# Patient Record
Sex: Male | Born: 1990 | Race: Black or African American | Hispanic: No | Marital: Single | Smoking: Former smoker
Health system: Southern US, Community
[De-identification: ages and names within clinical notes are randomized; demographics above are authoritative.]

## PROBLEM LIST (undated history)

## (undated) DIAGNOSIS — A549 Gonococcal infection, unspecified: Secondary | ICD-10-CM

## (undated) DIAGNOSIS — W3400XA Accidental discharge from unspecified firearms or gun, initial encounter: Secondary | ICD-10-CM

## (undated) HISTORY — PX: COLOSTOMY: SHX63

## (undated) HISTORY — PX: COLON SURGERY: SHX602

---
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
## 2009-12-06 ENCOUNTER — Emergency Department (HOSPITAL_COMMUNITY): Admission: EM | Admit: 2009-12-06 | Discharge: 2009-12-06 | Payer: Self-pay | Admitting: Family Medicine

## 2009-12-24 ENCOUNTER — Ambulatory Visit (HOSPITAL_COMMUNITY): Admission: RE | Admit: 2009-12-24 | Discharge: 2009-12-24 | Payer: Self-pay | Admitting: Orthopedic Surgery

## 2011-06-08 LAB — COMPREHENSIVE METABOLIC PANEL
CO2: 28
Calcium: 9.2
Chloride: 107
Creatinine, Ser: 1.05
Glucose, Bld: 106 — ABNORMAL HIGH
Total Bilirubin: 0.7

## 2011-06-08 LAB — URINALYSIS, ROUTINE W REFLEX MICROSCOPIC
Glucose, UA: NEGATIVE
Ketones, ur: 15 — AB
Protein, ur: NEGATIVE
Specific Gravity, Urine: 1.027
Urobilinogen, UA: 1

## 2011-06-08 LAB — DIFFERENTIAL
Basophils Absolute: 0.1
Basophils Relative: 1
Lymphocytes Relative: 18 — ABNORMAL LOW
Neutro Abs: 6
Neutrophils Relative %: 70

## 2011-06-08 LAB — CBC: RBC: 4.41

## 2011-06-08 LAB — ETHANOL: Alcohol, Ethyl (B): <5

## 2011-06-08 LAB — RAPID URINE DRUG SCREEN, HOSP PERFORMED
Barbiturates: NOT DETECTED
Benzodiazepines: NOT DETECTED
Opiates: NOT DETECTED

## 2012-10-24 ENCOUNTER — Emergency Department (HOSPITAL_COMMUNITY)
Admission: EM | Admit: 2012-10-24 | Discharge: 2012-10-24 | Disposition: A | Discharge status: Home / Self Care | Payer: Self-pay | Source: Home / Self Care

## 2012-12-23 ENCOUNTER — Emergency Department (HOSPITAL_COMMUNITY)
Admission: EM | Admit: 2012-12-23 | Discharge: 2012-12-23 | Disposition: A | Discharge status: Home / Self Care | Payer: Self-pay | Attending: Emergency Medicine | Admitting: Emergency Medicine

## 2012-12-23 ENCOUNTER — Encounter (HOSPITAL_COMMUNITY): Payer: Self-pay | Admitting: Emergency Medicine

## 2012-12-23 DIAGNOSIS — Z202 Contact with and (suspected) exposure to infections with a predominantly sexual mode of transmission: Secondary | ICD-10-CM | POA: Insufficient documentation

## 2012-12-23 MED ORDER — AZITHROMYCIN 250 MG PO TABS
1000.0000 mg | ORAL_TABLET | Freq: Once | ORAL | Status: AC
  Administered 2012-12-23: 1000 mg via ORAL
  Filled 2012-12-23: qty 4

## 2012-12-23 MED ORDER — CEFTRIAXONE SODIUM 250 MG IJ SOLR
250.0000 mg | Freq: Once | INTRAMUSCULAR | Status: AC
  Administered 2012-12-23: 250 mg via INTRAMUSCULAR
  Filled 2012-12-23: qty 250

## 2012-12-23 NOTE — ED Provider Notes (Signed)
History     CSN: [id number]  Arrival date & time 12/23/12  1152   First MD Initiated Contact with Patient 12/23/12 1218      Chief Complaint  Patient presents with  . Penile Discharge    (Consider location/radiation/quality/duration/timing/severity/associated sxs/prior treatment) HPI Comments: Patient is a 22 year old male who presented after an STD exposure. He states his partner told him today that he was exposed to gonorrhea. He states that he was experiencing penile discharge 2 weeks ago which has resolved. No fever, chills, dysuria, dyspareunia, nausea, vomiting, abdominal pain.  Patient is a 22 y.o. male presenting with penile discharge.  Penile Discharge Pertinent negatives include no chills or fever.    History reviewed. No pertinent past medical history.  History reviewed. No pertinent past surgical history.  No family history on file.  History  Substance Use Topics  . Smoking status: Not on file  . Smokeless tobacco: Not on file  . Alcohol Use: Not on file      Review of Systems  Constitutional: Negative for fever and chills.  Genitourinary: Positive for discharge. Negative for dysuria and genital sores.  All other systems reviewed and are negative.    Allergies  Review of patient's allergies indicates not on file.  Home Medications   Current Outpatient Rx  Name  Route  Sig  Dispense  Refill  . acetaminophen (TYLENOL) 500 MG tablet   Oral   Take 500 mg by mouth every 6 (six) hours as needed for pain (pain).           BP 114/68  Pulse 66  Temp(Src) 97.5 F (36.4 C) (Oral)  Resp 16  SpO2 99%  Physical Exam  Nursing note and vitals reviewed. Constitutional: He is oriented to person, place, and time. He appears well-developed and well-nourished. No distress.  HENT:  Head: Normocephalic and atraumatic.  Right Ear: External ear normal.  Left Ear: External ear normal.  Nose: Nose normal.  Eyes: Conjunctivae are normal.  Neck: Normal range  of motion. No tracheal deviation present.  Cardiovascular: Normal rate, regular rhythm and normal heart sounds.   Pulmonary/Chest: Effort normal and breath sounds normal. No stridor.  Abdominal: Soft. He exhibits no distension. There is no tenderness.  Genitourinary: Testes normal and penis normal. Right testis shows no mass, no swelling and no tenderness. Left testis shows no mass, no swelling and no tenderness. Circumcised.  Musculoskeletal: Normal range of motion.  Neurological: He is alert and oriented to person, place, and time.  Skin: Skin is warm and dry. He is not diaphoretic.  Psychiatric: He has a normal mood and affect. His behavior is normal.    ED Course  Procedures (including critical care time)  Labs Reviewed  GC/CHLAMYDIA PROBE AMP   No results found.   1. Exposure to STD       MDM  Patient presents after an exposure to gonorrhea. He noticed some discharge, but does not have any today. Afebrile. Given 250 mg of Rocephin IM, 1 g of azithromycin by mouth. Discussed that he will get a phone call at the result is positive. Return instructions given. And stable for discharge. Patient / Family / Caregiver informed of clinical course, understand medical decision-making process, and agree with plan.         Mora Bellman, PA-C 12/23/12 1236

## 2012-12-23 NOTE — ED Notes (Signed)
Pt was told this am that the person he was sleeping with had an std and that 2 weeks ago he did notice a penis discharge but none today.

## 2012-12-24 LAB — GC/CHLAMYDIA PROBE AMP: GC Probe RNA: POSITIVE — AB

## 2012-12-24 NOTE — ED Provider Notes (Signed)
Medical screening examination/treatment/procedure(s) were performed by non-physician practitioner and as supervising physician I was immediately available for consultation/collaboration.   Suzi Roots, MD 12/24/12 [phone]

## 2012-12-25 NOTE — ED Notes (Signed)
+  gonorrhea Patient treated with Rocephin And Zithromax-[facility] faxed 

## 2012-12-26 NOTE — ED Notes (Signed)
Patient informed of positive results after id'd x 2 and informed of need to notify partner to be treated. 

## 2015-12-28 ENCOUNTER — Emergency Department (HOSPITAL_COMMUNITY)
Admission: EM | Admit: 2015-12-28 | Discharge: 2015-12-28 | Disposition: A | Discharge status: Home / Self Care | Payer: Self-pay | Attending: Emergency Medicine | Admitting: Emergency Medicine

## 2015-12-28 ENCOUNTER — Encounter (HOSPITAL_COMMUNITY): Payer: Self-pay

## 2015-12-28 DIAGNOSIS — R1011 Right upper quadrant pain: Secondary | ICD-10-CM | POA: Insufficient documentation

## 2015-12-28 DIAGNOSIS — R7989 Other specified abnormal findings of blood chemistry: Secondary | ICD-10-CM | POA: Insufficient documentation

## 2015-12-28 DIAGNOSIS — R110 Nausea: Secondary | ICD-10-CM | POA: Insufficient documentation

## 2015-12-28 DIAGNOSIS — R1013 Epigastric pain: Secondary | ICD-10-CM | POA: Insufficient documentation

## 2015-12-28 DIAGNOSIS — F17200 Nicotine dependence, unspecified, uncomplicated: Secondary | ICD-10-CM | POA: Insufficient documentation

## 2015-12-28 LAB — CBC
HEMATOCRIT: 47.4 % (ref 39.0–52.0)
Hemoglobin: 16 g/dL (ref 13.0–17.0)
MCH: 31.1 pg (ref 26.0–34.0)
MCHC: 33.8 g/dL (ref 30.0–36.0)
MCV: 92 fL (ref 78.0–100.0)
PLATELETS: 238 10*3/uL (ref 150–400)
RBC: 5.15 MIL/uL (ref 4.22–5.81)
RDW: 12.7 % (ref 11.5–15.5)
WBC: 5.5 10*3/uL (ref 4.0–10.5)

## 2015-12-28 LAB — COMPREHENSIVE METABOLIC PANEL
ALT: 17 U/L (ref 17–63)
AST: 25 U/L (ref 15–41)
Albumin: 3.9 g/dL (ref 3.5–5.0)
Alkaline Phosphatase: 55 U/L (ref 38–126)
Anion gap: 9 (ref 5–15)
BILIRUBIN TOTAL: 1 mg/dL (ref 0.3–1.2)
BUN: 13 mg/dL (ref 6–20)
CO2: 27 mmol/L (ref 22–32)
Calcium: 9.1 mg/dL (ref 8.9–10.3)
Chloride: 104 mmol/L (ref 101–111)
Creatinine, Ser: 1.25 mg/dL — ABNORMAL HIGH (ref 0.61–1.24)
GFR calc Af Amer: >60 mL/min (ref >60)
Glucose, Bld: 120 mg/dL — ABNORMAL HIGH (ref 65–99)
POTASSIUM: 4 mmol/L (ref 3.5–5.1)
Sodium: 140 mmol/L (ref 135–145)
TOTAL PROTEIN: 7.4 g/dL (ref 6.5–8.1)

## 2015-12-28 LAB — LIPASE, BLOOD: LIPASE: 25 U/L (ref 11–51)

## 2015-12-28 MED ORDER — ONDANSETRON 4 MG PO TBDP
8.0000 mg | ORAL_TABLET | Freq: Once | ORAL | Status: AC
Start: 2015-12-28 — End: 2015-12-28
  Administered 2015-12-28: 8 mg via ORAL
  Filled 2015-12-28: qty 2

## 2015-12-28 MED ORDER — GI COCKTAIL ~~LOC~~
30.0000 mL | Freq: Once | ORAL | Status: AC
  Administered 2015-12-28: 30 mL via ORAL
  Filled 2015-12-28: qty 30

## 2015-12-28 NOTE — Discharge Instructions (Signed)
If you were given medicines take as directed.  If you are on coumadin or contraceptives realize their levels and effectiveness is altered by many different medicines.  If you have any reaction (rash, tongues swelling, other) to the medicines stop taking and see a physician.   Take pepcid from the pharmacy. If your blood pressure was elevated in the ER make sure you follow up for management with a primary doctor or return for chest pain, shortness of breath or stroke symptoms.  Please follow up as directed and return to the ER or see a physician for new or worsening symptoms.  Thank you. Filed Vitals:   12/28/15 0940 12/28/15 1000 12/28/15 1015  BP: 114/84 123/81 115/75  Pulse: 72 66 70  Temp: 98.2 F (36.8 C)    TempSrc: Oral    Resp: 18    Height: 5\' 9"  (1.753 m)    Weight: 170 lb (77.111 kg)    SpO2: 99% 100% 99%

## 2015-12-28 NOTE — ED Notes (Signed)
Pt presents with onset of umbilical pain that woke him from sleep this morning.  +nausea; denies that pain radiates;  Reports last bowel movement was yesterday.

## 2015-12-28 NOTE — ED Provider Notes (Signed)
CSN: [id number]     Arrival date & time 12/28/15  [id number] History   First MD Initiated Contact with Patient 12/28/15 [phone]     Chief Complaint  Patient presents with  . Abdominal Pain     (Consider location/radiation/quality/duration/timing/severity/associated sxs/prior Treatment) HPI Comments: Wayne Pierce is a 25 y.o. male presents with epigastric pain and nausea. Symptoms started this morning. Pain is 8/10, sharp, and intermittent. No radiation. No fever, chills, night sweats. No emesis or hematemesis. No diarrhea, constipation, melena, hematochezia. No abdominal surgeries. No sick contacts, travel to endemic areas, or recent changes in food intake. Endorses weekend of heavy drinking. No NSAID use. No history of acid reflux. Current smoker.     Patient is a 25 y.o. male presenting with abdominal pain. The history is provided by the patient.  Abdominal Pain Pain location:  Epigastric Pain quality: sharp   Pain radiates to:  Does not radiate Associated symptoms: nausea     History reviewed. No pertinent past medical history. History reviewed. No pertinent past surgical history. History reviewed. No pertinent family history. Social History  Substance Use Topics  . Smoking status: Current Every Day Smoker -- 0.50 packs/day  . Smokeless tobacco: None  . Alcohol Use: None    Review of Systems  Gastrointestinal: Positive for nausea and abdominal pain.  All other systems reviewed and are negative.     Allergies  Shellfish allergy  Home Medications   Prior to Admission medications   Medication Sig Start Date End Date Taking? Authorizing Provider  acetaminophen (TYLENOL) 500 MG tablet Take 500 mg by mouth every 6 (six) hours as needed for pain (pain).    Historical Provider, MD   BP 118/80 mmHg  Pulse 72  Temp(Src) 98.2 F (36.8 C) (Oral)  Resp 18  Ht 5\' 9"  (1.753 m)  Wt 77.111 kg  BMI 25.09 kg/m2  SpO2 100% Physical Exam  Constitutional: He appears well-developed and  well-nourished. No distress.  HENT:  Head: Normocephalic and atraumatic.  Mouth/Throat: Oropharynx is clear and moist. No oropharyngeal exudate.  Eyes: Conjunctivae and EOM are normal. Pupils are equal, round, and reactive to light. Right eye exhibits no discharge. Left eye exhibits no discharge. No scleral icterus.  Neck: Normal range of motion. Neck supple.  Cardiovascular: Normal rate, regular rhythm, normal heart sounds and intact distal pulses.   No murmur heard. Pulmonary/Chest: Effort normal and breath sounds normal. No respiratory distress.  Abdominal: Soft. Bowel sounds are normal. There is tenderness ( Epigastric and RUQ). There is no rebound and no guarding (mild in epigastric).  Musculoskeletal: Normal range of motion.  Lymphadenopathy:    He has no cervical adenopathy.  Neurological: He is alert. Coordination normal.  Skin: Skin is warm and dry. He is not diaphoretic. Pallor: mild in epigastric and RUQ.  Psychiatric: He has a normal mood and affect. His behavior is normal.    ED Course  Procedures (including critical care time) Labs Review Labs Reviewed  COMPREHENSIVE METABOLIC PANEL - Abnormal; Notable for the following:    Glucose, Bld 120 (*)    Creatinine, Ser 1.25 (*)    All other components within normal limits  LIPASE, BLOOD  CBC    Imaging Review No results found. I have personally reviewed and evaluated these images and lab results as part of my medical decision-making.   EKG Interpretation None      MDM   Final diagnoses:  Epigastric pain    Wayne Pierce is a 25 y.o. male presents  to ED with complaint of epigastric pain and nausea x 1 day. Patient is afebrile, non-toxic, and VSS. Positive bowel sounds. Abdomen is soft. TTP of epigastric and RUQ. Mild elevation of serum creatinine at 1.25 from previous lab - may be transient; no urinary complaints, no flank pain. Doubt pancreatitis - lipase normal. CBC unremarkable. [place] of gallbladder performed by Dr.  Jodi Mourning shows normal appearing gallbladder and common bile duct; doubt acute cholecystitis or cholangitis. Will give Zofran and GI cocktail. Patient endorses weekend of heavy drinking. Suspect GI in nature ?gastritis, ?GERD, ?PUD. Denies hematemesis, hematechezia, or melena. Recommended pepcid. Follow up with primary care provider in next three days - contact information provided. Return precautions provided.     Lona Kettle, PA-C 12/28/15 1810  Blane Ohara, MD 12/29/15 [phone]

## 2016-01-14 ENCOUNTER — Encounter (HOSPITAL_COMMUNITY): Payer: Self-pay

## 2016-01-14 ENCOUNTER — Emergency Department (HOSPITAL_COMMUNITY)
Admission: EM | Admit: 2016-01-14 | Discharge: 2016-01-14 | Disposition: A | Discharge status: Home / Self Care | Payer: Self-pay | Attending: Emergency Medicine | Admitting: Emergency Medicine

## 2016-01-14 DIAGNOSIS — N342 Other urethritis: Secondary | ICD-10-CM | POA: Insufficient documentation

## 2016-01-14 DIAGNOSIS — F17200 Nicotine dependence, unspecified, uncomplicated: Secondary | ICD-10-CM | POA: Insufficient documentation

## 2016-01-14 HISTORY — DX: Gonococcal infection, unspecified: A54.9

## 2016-01-14 MED ORDER — AZITHROMYCIN 250 MG PO TABS
1000.0000 mg | ORAL_TABLET | Freq: Once | ORAL | Status: AC
  Administered 2016-01-14: 1000 mg via ORAL
  Filled 2016-01-14: qty 4

## 2016-01-14 MED ORDER — CEFTRIAXONE SODIUM 250 MG IJ SOLR
250.0000 mg | Freq: Once | INTRAMUSCULAR | Status: AC
  Administered 2016-01-14: 250 mg via INTRAMUSCULAR
  Filled 2016-01-14: qty 250

## 2016-01-14 NOTE — ED Provider Notes (Signed)
CSN: [id number]     Arrival date & time 01/14/16  0813 History   First MD Initiated Contact with Patient 01/14/16 [phone]     No chief complaint on file.    (Consider location/radiation/quality/duration/timing/severity/associated sxs/prior Treatment) HPI Comments: Patient presents with complaint of penile discharge like previous gonococcal infection. No fever, testicular pain or scrotal swelling. No abdominal pain, nausea or vomiting. He has no difficulty urinating. No rash or ulcerations.   The history is provided by the patient. No language interpreter was used.    Past Medical History  Diagnosis Date  . Gonorrhea in male    History reviewed. No pertinent past surgical history. History reviewed. No pertinent family history. Social History  Substance Use Topics  . Smoking status: Current Every Day Smoker -- 0.50 packs/day  . Smokeless tobacco: None  . Alcohol Use: None    Review of Systems  Constitutional: Negative for fever.  Gastrointestinal: Negative for abdominal pain.  Genitourinary: Positive for discharge. Negative for difficulty urinating and testicular pain.  Skin: Negative for rash and wound.      Allergies  Shellfish allergy  Home Medications   Prior to Admission medications   Medication Sig Start Date End Date Taking? Authorizing Provider  acetaminophen (TYLENOL) 500 MG tablet Take 500 mg by mouth every 6 (six) hours as needed for pain (pain).    Historical Provider, MD   BP 135/74 mmHg  Pulse 98  Temp(Src) 98.7 F (37.1 C) (Oral)  Resp 18  Ht 5\' 9"  (1.753 m)  Wt 79.379 kg  BMI 25.83 kg/m2  SpO2 100% Physical Exam  Constitutional: He is oriented to person, place, and time. He appears well-developed and well-nourished.  Neck: Normal range of motion.  Pulmonary/Chest: Effort normal.  Abdominal: Soft. There is no tenderness.  Genitourinary:  Green penile discharge. No penile swelling, redness or lesion. No testicular pain or scrotal swelling.    Musculoskeletal: Normal range of motion.  Lymphadenopathy:       Right: Inguinal adenopathy present.       Left: Inguinal adenopathy present.  Neurological: He is alert and oriented to person, place, and time.  Skin: Skin is warm and dry.  Psychiatric: He has a normal mood and affect.    ED Course  Procedures (including critical care time) Labs Review Labs Reviewed  GC/CHLAMYDIA PROBE AMP () NOT AT [facility]    Imaging Review No results found. I have personally reviewed and evaluated these images and lab results as part of my medical decision-making.   EKG Interpretation None      MDM   Final diagnoses:  None    1. 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 Discussed condom use. Discussed further STD testing, specifically RPR and HIV. Patient declined and stated he has been tested for these infections.     Elpidio AnisShari Evadean Sproule, PA-C 01/14/16 0840  Benjiman CoreNathan Pickering, MD 01/14/16 [phone]

## 2016-01-14 NOTE — Discharge Instructions (Signed)
Urethritis, Adult °Urethritis is an inflammation of the tube through which urine exits your bladder (urethra).  °CAUSES °Urethritis is often caused by an infection in your urethra. The infection can be viral, like herpes. The infection can also be bacterial, like gonorrhea. °RISK FACTORS °Risk factors of urethritis include: °· Having sex without using a condom. °· Having multiple sexual partners. °· Having poor hygiene. °SIGNS AND SYMPTOMS °Symptoms of urethritis are less noticeable in women than in men. These symptoms include: °· Burning feeling when you urinate (dysuria). °· Discharge from your urethra. °· Blood in your urine (hematuria). °· Urinating more than usual. °DIAGNOSIS  °To confirm a diagnosis of urethritis, your health care provider will do the following: °· Ask about your sexual history. °· Perform a physical exam. °· Have you provide a sample of your urine for lab testing. °· Use a cotton swab to gently collect a sample from your urethra for lab testing. °TREATMENT  °It is important to treat urethritis. Depending on the cause, untreated urethritis may lead to serious genital infections and possibly infertility. Urethritis caused by a bacterial infection is treated with antibiotic medicine. All sexual partners must be treated.  °HOME CARE INSTRUCTIONS °· Do not have sex until the test results are known and treatment is completed, even if your symptoms go away before you finish treatment. °· If you were prescribed an antibiotic, finish it all even if you start to feel better. °SEEK MEDICAL CARE IF:  °· Your symptoms are not improved in 3 days. °· Your symptoms are getting worse. °· You develop abdominal pain or pelvic pain (in women). °· You develop joint pain. °· You have a fever. °SEEK IMMEDIATE MEDICAL CARE IF:  °· You have severe pain in the belly, back, or side. °· You have repeated vomiting. °MAKE SURE YOU: °· Understand these instructions. °· Will watch your condition. °· Will get help right away  if you are not doing well or get worse. °  °This information is not intended to replace advice given to you by your health care provider. Make sure you discuss any questions you have with your health care provider. °  °Document Released: 01/31/2001 Document Revised: 12/22/2014 Document Reviewed: 04/07/2013 °Elsevier Interactive Patient Education ©2016 [employer] ° °

## 2016-01-14 NOTE — ED Notes (Signed)
Pt presents with penile drainage that began this morning, describes as green; reports suprapubic pain that began today as well.

## 2016-01-18 LAB — GC/CHLAMYDIA PROBE AMP (~~LOC~~) NOT AT ARMC
CHLAMYDIA, DNA PROBE: NEGATIVE
NEISSERIA GONORRHEA: POSITIVE — AB

## 2016-01-19 ENCOUNTER — Telehealth: Payer: Self-pay | Admitting: *Deleted

## 2016-01-19 NOTE — ED Notes (Signed)
(+)   GC, unable to contact by phone, letter sent. 

## 2016-01-19 NOTE — ED Notes (Signed)
Spoke with patient, verified ID, informed of labs, treated per protocol, [employer] form faxed, patient informed to abstain for sexual activity x 10 days and notify sexual partners for evaluation and treatment 

## 2018-06-21 ENCOUNTER — Other Ambulatory Visit: Payer: Self-pay

## 2018-06-21 ENCOUNTER — Emergency Department (HOSPITAL_COMMUNITY): Payer: Self-pay

## 2018-06-21 ENCOUNTER — Emergency Department (HOSPITAL_COMMUNITY)
Admission: EM | Admit: 2018-06-21 | Discharge: 2018-06-21 | Discharge status: Against Medical Advice | Payer: Self-pay | Attending: Emergency Medicine | Admitting: Emergency Medicine

## 2018-06-21 ENCOUNTER — Encounter (HOSPITAL_COMMUNITY): Payer: Self-pay

## 2018-06-21 DIAGNOSIS — F17210 Nicotine dependence, cigarettes, uncomplicated: Secondary | ICD-10-CM | POA: Insufficient documentation

## 2018-06-21 DIAGNOSIS — Z5320 Procedure and treatment not carried out because of patient's decision for unspecified reasons: Secondary | ICD-10-CM | POA: Insufficient documentation

## 2018-06-21 DIAGNOSIS — M549 Dorsalgia, unspecified: Secondary | ICD-10-CM

## 2018-06-21 DIAGNOSIS — M545 Low back pain: Secondary | ICD-10-CM | POA: Insufficient documentation

## 2018-06-21 DIAGNOSIS — M546 Pain in thoracic spine: Secondary | ICD-10-CM | POA: Insufficient documentation

## 2018-06-21 MED ORDER — KETOROLAC TROMETHAMINE 60 MG/2ML IM SOLN: 60.0000 mg | Freq: Once | INTRAMUSCULAR | Status: DC

## 2018-06-21 NOTE — ED Triage Notes (Signed)
Patient c/o bilateral lower back pain that radiates down both legs x 3 months and has been getting progressively worse. Patient states he has been taking Percocet from a friend for his back pain and states he just does not feel right. Patient states he last took the meds at 0200 today.

## 2018-06-21 NOTE — ED Provider Notes (Signed)
[facility]-EMERGENCY DEPT Provider Note   CSN: [id number] Arrival date & time: 06/21/18  0855     History   Chief Complaint Chief Complaint  Patient presents with  . Back Pain    HPI Wayne Pierce is a 27 y.o. male with no significant past medical history who presents for evaluation of upper and lower back pain that is been ongoing for last 3 months.  He denies any preceding trauma, injury, fall.  He states that the pain is mostly in the middle and radiates the sides.  He states that it is worse when he bends down or tries to move.  He states he has not noticed it being worse at night and states it only is worse when he turns around in bed.  He has been able to sleep without any difficulty.  Patient denies any fevers, weight loss.  Patient states that he would take ibuprofen at home.  He recently started taking some Percocet that he got from his friend which he states did not help his pain.  Patient states he came today because "it is making him feel bad" and his pain is getting worse.  He reports he has been able to ambulate without any difficulty.  Denies fevers, weight loss, numbness/weakness of upper and lower extremities, bowel/bladder incontinence, saddle anesthesia, history of back surgery, history of IVDA. Patient denies any CP, SOB, abdominal pain.    The history is provided by the patient.    Past Medical History:  Diagnosis Date  . Gonorrhea in male     There are no active problems to display for this patient.   History reviewed. No pertinent surgical history.      Home Medications    Prior to Admission medications   Medication Sig Start Date End Date Taking? Authorizing Provider  acetaminophen (TYLENOL) 500 MG tablet Take 500 mg by mouth every 6 (six) hours as needed for pain (pain).    [provider]    Family History History reviewed. No pertinent family history.  Social History Social History   Tobacco Use  . Smoking  status: Current Every Day Smoker    Packs/day: 0.15    Types: Cigarettes  . Smokeless tobacco: Never Used  Substance Use Topics  . Alcohol use: Yes  . Drug use: Never     Allergies   Shellfish allergy   Review of Systems Review of Systems  Constitutional: Negative for fever and unexpected weight change.  Respiratory: Negative for shortness of breath.   Cardiovascular: Negative for chest pain.  Gastrointestinal: Negative for abdominal pain.  Genitourinary: Negative for dysuria and hematuria.  Musculoskeletal: Positive for back pain. Negative for neck pain.  Neurological: Negative for weakness and numbness.  All other systems reviewed and are negative.    Physical Exam Updated Vital Signs BP 130/82 (BP Location: Left Arm)   Pulse 89   Temp 97.8 F (36.6 C) (Oral)   Resp 14   Ht 5\' 9"  (1.753 m)   Wt 81.6 kg   SpO2 100%   BMI 26.58 kg/m   Physical Exam  Constitutional: He appears well-developed and well-nourished.  HENT:  Head: Normocephalic and atraumatic.  Eyes: Conjunctivae and EOM are normal. Right eye exhibits no discharge. Left eye exhibits no discharge. No scleral icterus.  Neck: Full passive range of motion without pain.  Full flexion/extension and lateral movement of neck fully intact. No bony midline tenderness. No deformities or crepitus.   Pulmonary/Chest: Effort normal.  Musculoskeletal:  Thoracic back: He exhibits tenderness.       Lumbar back: He exhibits tenderness.       Back:  Diffuse tenderness overlying the T and L-spine region that extends over the midline.  No deformity or crepitus noted.  Limited flexion secondary to pain.  Extension intact.  Neurological: He is alert.  Reflex Scores:      Patellar reflexes are 2+ on the right side and 2+ on the left side. Follows commands, Moves all extremities  5/5 strength to BUE and BLE  Sensation intact throughout all major nerve distributions Normal gait  Skin: Skin is warm and dry.    Psychiatric: He has a normal mood and affect. His speech is normal and behavior is normal.  Nursing note and vitals reviewed.    ED Treatments / Results  Labs (all labs ordered are listed, but only abnormal results are displayed) Labs Reviewed - No data to display  EKG None  Radiology No results found.  Procedures Procedures (including critical care time)  Medications Ordered in ED Medications  ketorolac (TORADOL) injection 60 mg (has no administration in time range)     Initial Impression / Assessment and Plan / ED Course  I have reviewed the triage vital signs and the nursing notes.  Pertinent labs & imaging results that were available during my care of the patient were reviewed by me and considered in my medical decision making (see chart for details).     27 year old male who presents for evaluation of back pain that is been ongoing for last 3 months.  No associated fever, weight loss, numbness/weakness, saddle anesthesia, urinary or bowel incontinence.  Triage note mentions that it radiates into both of his legs.  Patient denies any radiation of pain for me.  He does state that when he bends over, he feels it pull over his muscles in his leg. Patient is afebrile, non-toxic appearing, sitting comfortably on examination table. Vital signs reviewed and stable. No neuro deficits noted on exam.  Consider muscle strain.  Low suspicion for fracture dislocation given duration of symptoms.  Given duration of symptoms, will plan for x-ray evaluation.  History/physical exam is not concerning for cauda equina, spinal abscess.  Radiology tech informed me that patient was not there when they went to get him for XRs. I went to patient's room and he had eloped from the ED.   Final Clinical Impressions(s) / ED Diagnoses   Final diagnoses:  Back pain    ED Discharge Orders    None       Maxwell Caul, PA-C 06/21/18 1019    Derwood Kaplan, MD 06/22/18 [phone]

## 2018-08-15 ENCOUNTER — Inpatient Hospital Stay (HOSPITAL_COMMUNITY): Payer: Self-pay

## 2018-08-15 ENCOUNTER — Emergency Department (HOSPITAL_COMMUNITY): Payer: Self-pay | Admitting: Certified Registered Nurse Anesthetist

## 2018-08-15 ENCOUNTER — Emergency Department (HOSPITAL_COMMUNITY): Payer: Self-pay

## 2018-08-15 ENCOUNTER — Other Ambulatory Visit: Payer: Self-pay

## 2018-08-15 ENCOUNTER — Encounter (HOSPITAL_COMMUNITY): Payer: Self-pay

## 2018-08-15 ENCOUNTER — Encounter (HOSPITAL_COMMUNITY): Admission: EM | Disposition: A | Discharge status: Home Health | Payer: Self-pay | Source: Home / Self Care

## 2018-08-15 ENCOUNTER — Inpatient Hospital Stay (HOSPITAL_COMMUNITY)
Admission: EM | Admit: 2018-08-15 | Discharge: 2018-08-25 | DRG: 958 | Disposition: A | Discharge status: Home Health | Payer: Self-pay | Attending: General Surgery | Admitting: General Surgery

## 2018-08-15 DIAGNOSIS — K567 Ileus, unspecified: Secondary | ICD-10-CM | POA: Diagnosis not present

## 2018-08-15 DIAGNOSIS — S3219XA Other fracture of sacrum, initial encounter for closed fracture: Secondary | ICD-10-CM | POA: Diagnosis present

## 2018-08-15 DIAGNOSIS — I959 Hypotension, unspecified: Secondary | ICD-10-CM | POA: Diagnosis present

## 2018-08-15 DIAGNOSIS — S91101A Unspecified open wound of right great toe without damage to nail, initial encounter: Secondary | ICD-10-CM | POA: Diagnosis present

## 2018-08-15 DIAGNOSIS — S31823A Puncture wound without foreign body of left buttock, initial encounter: Secondary | ICD-10-CM | POA: Diagnosis present

## 2018-08-15 DIAGNOSIS — N179 Acute kidney failure, unspecified: Secondary | ICD-10-CM | POA: Diagnosis present

## 2018-08-15 DIAGNOSIS — R066 Hiccough: Secondary | ICD-10-CM | POA: Diagnosis present

## 2018-08-15 DIAGNOSIS — Z4659 Encounter for fitting and adjustment of other gastrointestinal appliance and device: Secondary | ICD-10-CM

## 2018-08-15 DIAGNOSIS — E860 Dehydration: Secondary | ICD-10-CM | POA: Diagnosis present

## 2018-08-15 DIAGNOSIS — W3400XA Accidental discharge from unspecified firearms or gun, initial encounter: Secondary | ICD-10-CM

## 2018-08-15 DIAGNOSIS — E871 Hypo-osmolality and hyponatremia: Secondary | ICD-10-CM

## 2018-08-15 DIAGNOSIS — Z91013 Allergy to seafood: Secondary | ICD-10-CM

## 2018-08-15 DIAGNOSIS — S36598A Other injury of other part of colon, initial encounter: Principal | ICD-10-CM | POA: Diagnosis present

## 2018-08-15 DIAGNOSIS — R140 Abdominal distension (gaseous): Secondary | ICD-10-CM

## 2018-08-15 HISTORY — PX: LAPAROTOMY: SHX154

## 2018-08-15 LAB — CBC
HCT: 42.8 % (ref 39.0–52.0)
HCT: 41.4 % (ref 39.0–52.0)
HCT: 41.5 % (ref 39.0–52.0)
HEMOGLOBIN: 14.5 g/dL (ref 13.0–17.0)
Hemoglobin: 14 g/dL (ref 13.0–17.0)
Hemoglobin: 14.6 g/dL (ref 13.0–17.0)
MCH: 30.1 pg (ref 26.0–34.0)
MCH: 30.9 pg (ref 26.0–34.0)
MCH: 30.5 pg (ref 26.0–34.0)
MCHC: 32.7 g/dL (ref 30.0–36.0)
MCHC: 35 g/dL (ref 30.0–36.0)
MCHC: 35.2 g/dL (ref 30.0–36.0)
MCV: 92 fL (ref 80.0–100.0)
MCV: 88.3 fL (ref 80.0–100.0)
MCV: 86.8 fL (ref 80.0–100.0)
PLATELETS: 298 10*3/uL (ref 150–400)
Platelets: 239 10*3/uL (ref 150–400)
Platelets: 244 10*3/uL (ref 150–400)
RBC: 4.65 MIL/uL (ref 4.22–5.81)
RBC: 4.69 MIL/uL (ref 4.22–5.81)
RBC: 4.78 MIL/uL (ref 4.22–5.81)
RDW: 12.5 % (ref 11.5–15.5)
RDW: 12.6 % (ref 11.5–15.5)
RDW: 12.6 % (ref 11.5–15.5)
WBC: 9.7 10*3/uL (ref 4.0–10.5)
WBC: 12 10*3/uL — ABNORMAL HIGH (ref 4.0–10.5)
WBC: 10.9 10*3/uL — ABNORMAL HIGH (ref 4.0–10.5)
nRBC: 0 % (ref 0.0–0.2)
nRBC: 0 % (ref 0.0–0.2)
nRBC: 0 % (ref 0.0–0.2)

## 2018-08-15 LAB — POCT I-STAT 7, (LYTES, BLD GAS, ICA,H+H)
Acid-base deficit: 7 mmol/L — ABNORMAL HIGH (ref 0.0–2.0)
Bicarbonate: 20.4 mmol/L (ref 20.0–28.0)
Bicarbonate: 24.2 mmol/L (ref 20.0–28.0)
Calcium, Ion: 0.92 mmol/L — ABNORMAL LOW (ref 1.15–1.40)
Calcium, Ion: 1 mmol/L — ABNORMAL LOW (ref 1.15–1.40)
HCT: 34 % — ABNORMAL LOW (ref 39.0–52.0)
HEMATOCRIT: 38 % — AB (ref 39.0–52.0)
Hemoglobin: 11.6 g/dL — ABNORMAL LOW (ref 13.0–17.0)
Hemoglobin: 12.9 g/dL — ABNORMAL LOW (ref 13.0–17.0)
O2 Saturation: 100 %
O2 Saturation: 100 %
Potassium: 4.7 mmol/L (ref 3.5–5.1)
Potassium: 5 mmol/L (ref 3.5–5.1)
Sodium: 140 mmol/L (ref 135–145)
Sodium: 141 mmol/L (ref 135–145)
TCO2: 22 mmol/L (ref 22–32)
TCO2: 25 mmol/L (ref 22–32)
pCO2 arterial: 38.7 mmHg (ref 32.0–48.0)
pCO2 arterial: 48.3 mmHg — ABNORMAL HIGH (ref 32.0–48.0)
pH, Arterial: 7.234 — ABNORMAL LOW (ref 7.350–7.450)
pH, Arterial: 7.404 (ref 7.350–7.450)
pO2, Arterial: 189 mmHg — ABNORMAL HIGH (ref 83.0–108.0)
pO2, Arterial: 269 mmHg — ABNORMAL HIGH (ref 83.0–108.0)

## 2018-08-15 LAB — PROTIME-INR
INR: 1.06
Prothrombin Time: 13.7 seconds (ref 11.4–15.2)

## 2018-08-15 LAB — BLOOD PRODUCT ORDER (VERBAL) VERIFICATION

## 2018-08-15 LAB — COMPREHENSIVE METABOLIC PANEL
ALT: 35 U/L (ref 0–44)
ANION GAP: 23 — AB (ref 5–15)
AST: 42 U/L — ABNORMAL HIGH (ref 15–41)
Albumin: 3.7 g/dL (ref 3.5–5.0)
Alkaline Phosphatase: 56 U/L (ref 38–126)
BUN: 17 mg/dL (ref 6–20)
CO2: 13 mmol/L — ABNORMAL LOW (ref 22–32)
Calcium: 8.7 mg/dL — ABNORMAL LOW (ref 8.9–10.3)
Chloride: 102 mmol/L (ref 98–111)
Creatinine, Ser: 1.89 mg/dL — ABNORMAL HIGH (ref 0.61–1.24)
GFR calc non Af Amer: 47 mL/min — ABNORMAL LOW (ref >60)
GFR, EST AFRICAN AMERICAN: 54 mL/min — AB (ref >60)
Glucose, Bld: 149 mg/dL — ABNORMAL HIGH (ref 70–99)
Potassium: 4.2 mmol/L (ref 3.5–5.1)
Sodium: 138 mmol/L (ref 135–145)
Total Bilirubin: 0.4 mg/dL (ref 0.3–1.2)
Total Protein: 7 g/dL (ref 6.5–8.1)

## 2018-08-15 LAB — I-STAT CHEM 8, ED
BUN: 18 mg/dL (ref 6–20)
CREATININE: 1.8 mg/dL — AB (ref 0.61–1.24)
Calcium, Ion: 1.09 mmol/L — ABNORMAL LOW (ref 1.15–1.40)
Chloride: 106 mmol/L (ref 98–111)
GLUCOSE: 142 mg/dL — AB (ref 70–99)
HCT: 44 % (ref 39.0–52.0)
Hemoglobin: 15 g/dL (ref 13.0–17.0)
Potassium: 4 mmol/L (ref 3.5–5.1)
Sodium: 139 mmol/L (ref 135–145)
TCO2: 16 mmol/L — ABNORMAL LOW (ref 22–32)

## 2018-08-15 LAB — URINALYSIS, ROUTINE W REFLEX MICROSCOPIC
Bilirubin Urine: NEGATIVE
Glucose, UA: NEGATIVE mg/dL
Hgb urine dipstick: NEGATIVE
Ketones, ur: NEGATIVE mg/dL
Leukocytes, UA: NEGATIVE
Nitrite: NEGATIVE
Protein, ur: NEGATIVE mg/dL
Specific Gravity, Urine: 1.016 (ref 1.005–1.030)
pH: 8 (ref 5.0–8.0)

## 2018-08-15 LAB — I-STAT CG4 LACTIC ACID, ED: LACTIC ACID, VENOUS: 14.34 mmol/L — AB (ref 0.5–1.9)

## 2018-08-15 LAB — MRSA PCR SCREENING: MRSA BY PCR: NEGATIVE

## 2018-08-15 LAB — ETHANOL: Alcohol, Ethyl (B): 124 mg/dL — ABNORMAL HIGH (ref <10)

## 2018-08-15 LAB — CDS SEROLOGY

## 2018-08-15 LAB — ABO/RH: ABO/RH(D): A POS

## 2018-08-15 SURGERY — LAPAROTOMY, EXPLORATORY
Anesthesia: General | Site: Abdomen

## 2018-08-15 MED ORDER — OXYCODONE HCL 5 MG PO TABS
5.0000 mg | ORAL_TABLET | ORAL | Status: DC | PRN
  Administered 2018-08-15 – 2018-08-17 (×11): 10 mg via ORAL
  Filled 2018-08-15 (×11): qty 2

## 2018-08-15 MED ORDER — NALOXONE HCL 0.4 MG/ML IJ SOLN: 0.4000 mg | INTRAMUSCULAR | Status: DC | PRN

## 2018-08-15 MED ORDER — ONDANSETRON HCL 4 MG/2ML IJ SOLN
INTRAMUSCULAR | Status: DC | PRN
  Administered 2018-08-15: 4 mg via INTRAVENOUS

## 2018-08-15 MED ORDER — LACTATED RINGERS IV SOLN
INTRAVENOUS | Status: DC | PRN
  Administered 2018-08-15: 05:00:00 via INTRAVENOUS

## 2018-08-15 MED ORDER — DEXAMETHASONE SODIUM PHOSPHATE 10 MG/ML IJ SOLN
INTRAMUSCULAR | Status: AC
  Filled 2018-08-15: qty 1

## 2018-08-15 MED ORDER — SODIUM CHLORIDE 0.9% FLUSH: 9.0000 mL | INTRAVENOUS | Status: DC | PRN

## 2018-08-15 MED ORDER — LIDOCAINE 2% (20 MG/ML) 5 ML SYRINGE
INTRAMUSCULAR | Status: AC
  Filled 2018-08-15: qty 5

## 2018-08-15 MED ORDER — LACTATED RINGERS IV SOLN
INTRAVENOUS | Status: DC | PRN
  Administered 2018-08-15: 04:00:00 via INTRAVENOUS

## 2018-08-15 MED ORDER — SUCCINYLCHOLINE CHLORIDE 20 MG/ML IJ SOLN
INTRAMUSCULAR | Status: DC | PRN
  Administered 2018-08-15: 140 mg via INTRAVENOUS

## 2018-08-15 MED ORDER — PROPOFOL 10 MG/ML IV BOLUS
INTRAVENOUS | Status: AC
  Filled 2018-08-15: qty 20

## 2018-08-15 MED ORDER — HYDRALAZINE HCL 20 MG/ML IJ SOLN: 10.0000 mg | INTRAMUSCULAR | Status: DC | PRN

## 2018-08-15 MED ORDER — SODIUM CHLORIDE 0.9 % IV SOLN
INTRAVENOUS | Status: DC | PRN
  Administered 2018-08-15: 04:00:00 via INTRAVENOUS

## 2018-08-15 MED ORDER — MIDAZOLAM HCL 2 MG/2ML IJ SOLN
INTRAMUSCULAR | Status: AC
  Filled 2018-08-15: qty 2

## 2018-08-15 MED ORDER — DIPHENHYDRAMINE HCL 50 MG/ML IJ SOLN
12.5000 mg | Freq: Four times a day (QID) | INTRAMUSCULAR | Status: DC | PRN
  Administered 2018-08-17: 12.5 mg via INTRAVENOUS
  Filled 2018-08-15: qty 1

## 2018-08-15 MED ORDER — ROCURONIUM BROMIDE 10 MG/ML (PF) SYRINGE
PREFILLED_SYRINGE | INTRAVENOUS | Status: DC | PRN
  Administered 2018-08-15: 50 mg via INTRAVENOUS
  Administered 2018-08-15 (×2): 20 mg via INTRAVENOUS
  Administered 2018-08-15: 30 mg via INTRAVENOUS

## 2018-08-15 MED ORDER — OXYCODONE HCL 5 MG PO TABS: 10.0000 mg | ORAL_TABLET | Freq: Once | ORAL | Status: DC

## 2018-08-15 MED ORDER — FENTANYL CITRATE (PF) 250 MCG/5ML IJ SOLN
INTRAMUSCULAR | Status: DC | PRN
  Administered 2018-08-15: 100 ug via INTRAVENOUS
  Administered 2018-08-15 (×2): 50 ug via INTRAVENOUS

## 2018-08-15 MED ORDER — ENOXAPARIN SODIUM 40 MG/0.4ML ~~LOC~~ SOLN
40.0000 mg | SUBCUTANEOUS | Status: DC
  Administered 2018-08-16 – 2018-08-24 (×9): 40 mg via SUBCUTANEOUS
  Filled 2018-08-15 (×9): qty 0.4

## 2018-08-15 MED ORDER — DIPHENHYDRAMINE HCL 12.5 MG/5ML PO ELIX
12.5000 mg | ORAL_SOLUTION | Freq: Four times a day (QID) | ORAL | Status: DC | PRN
  Filled 2018-08-15: qty 5

## 2018-08-15 MED ORDER — ONDANSETRON HCL 4 MG/2ML IJ SOLN
INTRAMUSCULAR | Status: AC
  Filled 2018-08-15: qty 2

## 2018-08-15 MED ORDER — SUGAMMADEX SODIUM 200 MG/2ML IV SOLN
INTRAVENOUS | Status: DC | PRN
  Administered 2018-08-15: 200 mg via INTRAVENOUS

## 2018-08-15 MED ORDER — SODIUM BICARBONATE 8.4 % IV SOLN
INTRAVENOUS | Status: AC
  Filled 2018-08-15: qty 50

## 2018-08-15 MED ORDER — HYDROMORPHONE HCL 1 MG/ML IJ SOLN
0.2500 mg | INTRAMUSCULAR | Status: DC | PRN
Start: 2018-08-15 — End: 2018-08-15

## 2018-08-15 MED ORDER — ROCURONIUM BROMIDE 50 MG/5ML IV SOSY
PREFILLED_SYRINGE | INTRAVENOUS | Status: AC
  Filled 2018-08-15: qty 15

## 2018-08-15 MED ORDER — OXYCODONE HCL 5 MG PO TABS: 5.0000 mg | ORAL_TABLET | ORAL | Status: DC | PRN

## 2018-08-15 MED ORDER — ONDANSETRON HCL 4 MG/2ML IJ SOLN
4.0000 mg | Freq: Four times a day (QID) | INTRAMUSCULAR | Status: DC | PRN
  Administered 2018-08-19: 4 mg via INTRAVENOUS
  Filled 2018-08-15: qty 2

## 2018-08-15 MED ORDER — SODIUM BICARBONATE 8.4 % IV SOLN
INTRAVENOUS | Status: DC | PRN
  Administered 2018-08-15 (×2): 50 meq via INTRAVENOUS

## 2018-08-15 MED ORDER — PANTOPRAZOLE SODIUM 40 MG PO TBEC
40.0000 mg | DELAYED_RELEASE_TABLET | Freq: Every day | ORAL | Status: DC
  Administered 2018-08-15: 40 mg via ORAL
  Filled 2018-08-15 (×2): qty 1

## 2018-08-15 MED ORDER — LACTATED RINGERS IV SOLN
INTRAVENOUS | Status: DC
  Administered 2018-08-15 – 2018-08-17 (×3): via INTRAVENOUS

## 2018-08-15 MED ORDER — HYDROMORPHONE 1 MG/ML IV SOLN
INTRAVENOUS | Status: DC
  Administered 2018-08-15: 3.9 mg via INTRAVENOUS
  Administered 2018-08-15: 2.9 mg via INTRAVENOUS
  Administered 2018-08-15: 2.3 mg via INTRAVENOUS
  Administered 2018-08-15 (×2): 25 mg via INTRAVENOUS
  Administered 2018-08-15: 3.6 mg via INTRAVENOUS
  Administered 2018-08-16: 4.2 mg via INTRAVENOUS
  Administered 2018-08-16: 6 mg via INTRAVENOUS
  Administered 2018-08-17 (×2): 2.1 mg via INTRAVENOUS
  Administered 2018-08-17: 2.7 mg via INTRAVENOUS
  Administered 2018-08-17: 1.5 mg via INTRAVENOUS
  Administered 2018-08-17: 08:00:00 via INTRAVENOUS
  Administered 2018-08-17: 1.5 mg via INTRAVENOUS
  Administered 2018-08-17: 0.6 mg via INTRAVENOUS
  Administered 2018-08-18 (×2): 1.8 mg via INTRAVENOUS
  Administered 2018-08-18: 3.9 mg via INTRAVENOUS
  Administered 2018-08-18: 1.2 mg via INTRAVENOUS
  Administered 2018-08-18: 1.5 mg via INTRAVENOUS
  Administered 2018-08-18 – 2018-08-19 (×2): 0.3 mg via INTRAVENOUS
  Administered 2018-08-19: 2.1 mg via INTRAVENOUS
  Administered 2018-08-19: 25 mg via INTRAVENOUS
  Administered 2018-08-19: 1.8 mg via INTRAVENOUS
  Filled 2018-08-15 (×4): qty 25

## 2018-08-15 MED ORDER — ALBUTEROL SULFATE HFA 108 (90 BASE) MCG/ACT IN AERS
INHALATION_SPRAY | RESPIRATORY_TRACT | Status: DC | PRN
  Administered 2018-08-15: 4 via RESPIRATORY_TRACT

## 2018-08-15 MED ORDER — SUCCINYLCHOLINE CHLORIDE 200 MG/10ML IV SOSY
PREFILLED_SYRINGE | INTRAVENOUS | Status: AC
  Filled 2018-08-15: qty 10

## 2018-08-15 MED ORDER — SODIUM CHLORIDE 0.9 % IV SOLN
2.0000 g | Freq: Once | INTRAVENOUS | Status: AC
  Administered 2018-08-15: 2 g via INTRAVENOUS
  Filled 2018-08-15 (×2): qty 2

## 2018-08-15 MED ORDER — PANTOPRAZOLE SODIUM 40 MG IV SOLR
40.0000 mg | Freq: Every day | INTRAVENOUS | Status: DC
  Administered 2018-08-16 – 2018-08-22 (×7): 40 mg via INTRAVENOUS
  Filled 2018-08-15 (×7): qty 40

## 2018-08-15 MED ORDER — 0.9 % SODIUM CHLORIDE (POUR BTL) OPTIME
TOPICAL | Status: DC | PRN
  Administered 2018-08-15: 1000 mL
  Administered 2018-08-15: 2000 mL

## 2018-08-15 MED ORDER — FENTANYL CITRATE (PF) 250 MCG/5ML IJ SOLN
INTRAMUSCULAR | Status: AC
  Filled 2018-08-15: qty 5

## 2018-08-15 MED ORDER — LIDOCAINE 2% (20 MG/ML) 5 ML SYRINGE
INTRAMUSCULAR | Status: DC | PRN
  Administered 2018-08-15: 80 mg via INTRAVENOUS

## 2018-08-15 MED ORDER — DEXAMETHASONE SODIUM PHOSPHATE 10 MG/ML IJ SOLN
INTRAMUSCULAR | Status: DC | PRN
  Administered 2018-08-15: 10 mg via INTRAVENOUS

## 2018-08-15 MED ORDER — ETOMIDATE 2 MG/ML IV SOLN
INTRAVENOUS | Status: DC | PRN
  Administered 2018-08-15: 12 mg via INTRAVENOUS

## 2018-08-15 SURGICAL SUPPLY — 47 items
BARRIER SKIN 2 1/4 (WOUND CARE) ×2 IMPLANT
BARRIER SKIN 2 1/4INCH (WOUND CARE) ×1
BLADE CLIPPER SURG (BLADE) ×3 IMPLANT
CANISTER SUCT 3000ML PPV (MISCELLANEOUS) ×3 IMPLANT
CHLORAPREP W/TINT 26ML (MISCELLANEOUS) ×3 IMPLANT
COVER SURGICAL LIGHT HANDLE (MISCELLANEOUS) ×3 IMPLANT
COVER WAND RF STERILE (DRAPES) ×3 IMPLANT
DRAPE LAPAROSCOPIC ABDOMINAL (DRAPES) ×3 IMPLANT
DRAPE WARM FLUID 44X44 (DRAPE) IMPLANT
DRESSING HYDROCOLLOID 4X4 XTH (GAUZE/BANDAGES/DRESSINGS) ×3 IMPLANT
DRSG OPSITE POSTOP 4X10 (GAUZE/BANDAGES/DRESSINGS) IMPLANT
DRSG OPSITE POSTOP 4X8 (GAUZE/BANDAGES/DRESSINGS) IMPLANT
ELECT BLADE 6.5 EXT (BLADE) ×3 IMPLANT
ELECT CAUTERY BLADE 6.4 (BLADE) ×3 IMPLANT
ELECT REM PT RETURN 9FT ADLT (ELECTROSURGICAL) ×3
ELECTRODE REM PT RTRN 9FT ADLT (ELECTROSURGICAL) ×1 IMPLANT
GLOVE BIO SURGEON STRL SZ8 (GLOVE) ×3 IMPLANT
GLOVE BIOGEL PI IND STRL 8 (GLOVE) ×1 IMPLANT
GLOVE BIOGEL PI INDICATOR 8 (GLOVE) ×2
GOWN STRL REUS W/ TWL LRG LVL3 (GOWN DISPOSABLE) ×1 IMPLANT
GOWN STRL REUS W/ TWL XL LVL3 (GOWN DISPOSABLE) ×1 IMPLANT
GOWN STRL REUS W/TWL LRG LVL3 (GOWN DISPOSABLE) ×2
GOWN STRL REUS W/TWL XL LVL3 (GOWN DISPOSABLE) ×2
KIT BASIN OR (CUSTOM PROCEDURE TRAY) ×3 IMPLANT
KIT TURNOVER KIT B (KITS) ×3 IMPLANT
LIGASURE IMPACT 36 18CM CVD LR (INSTRUMENTS) ×3 IMPLANT
NS IRRIG 1000ML POUR BTL (IV SOLUTION) ×6 IMPLANT
PACK GENERAL/GYN (CUSTOM PROCEDURE TRAY) ×3 IMPLANT
PAD ARMBOARD 7.5X6 YLW CONV (MISCELLANEOUS) ×3 IMPLANT
RELOAD PROXIMATE 75MM BLUE (ENDOMECHANICALS) ×3 IMPLANT
SPECIMEN JAR LARGE (MISCELLANEOUS) IMPLANT
SPONGE LAP 18X18 X RAY DECT (DISPOSABLE) ×3 IMPLANT
STAPLER CUT CVD 40MM BLUE (STAPLE) ×3 IMPLANT
STAPLER CUT RELOAD BLUE (STAPLE) ×3 IMPLANT
STAPLER PROXIMATE 75MM BLUE (STAPLE) ×3 IMPLANT
STAPLER VISISTAT 35W (STAPLE) ×3 IMPLANT
SUCTION POOLE TIP (SUCTIONS) ×3 IMPLANT
SUT PDS AB 1 TP1 96 (SUTURE) ×6 IMPLANT
SUT PROLENE 2 0 CT2 30 (SUTURE) ×3 IMPLANT
SUT SILK 2 0 SH CR/8 (SUTURE) ×3 IMPLANT
SUT SILK 2 0 TIES 10X30 (SUTURE) ×3 IMPLANT
SUT SILK 3 0 SH CR/8 (SUTURE) ×3 IMPLANT
SUT SILK 3 0 TIES 10X30 (SUTURE) ×3 IMPLANT
SUT VIC AB 3-0 SH 18 (SUTURE) ×3 IMPLANT
TOWEL OR 17X26 10 PK STRL BLUE (TOWEL DISPOSABLE) ×3 IMPLANT
TRAY FOLEY MTR SLVR 16FR STAT (SET/KITS/TRAYS/PACK) ×3 IMPLANT
YANKAUER SUCT BULB TIP NO VENT (SUCTIONS) ×3 IMPLANT
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
## 2018-08-15 NOTE — Anesthesia Postprocedure Evaluation (Signed)
Anesthesia Post Note  Patient: Clinical cytogeneticisthamell XXXTate  Procedure(s) Performed: EXPLORATORY LAPAROTOMY WITH PARTIAL COLECTOMY (N/A Abdomen)     Patient location during evaluation: PACU Anesthesia Type: General Level of consciousness: awake Pain management: pain level controlled Vital Signs Assessment: post-procedure vital signs reviewed and stable Respiratory status: spontaneous breathing Cardiovascular status: stable Postop Assessment: no apparent nausea or vomiting Anesthetic complications: no    Last Vitals:  Vitals:   08/15/18 0402 08/15/18 0610  BP: 99/76 122/80  Pulse: 98 (!) 107  Resp: 19 (!) 30  Temp:  (!) 36.3 C  SpO2: 100% 92%    Last Pain:  Vitals:   08/15/18 0617  TempSrc:   PainSc: Asleep                 Cana Mignano
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
## 2018-08-15 NOTE — Anesthesia Procedure Notes (Signed)
Procedure Name: Intubation Date/Time: 08/15/2018 4:30 AM Performed by: Candis Shine, CRNA Pre-anesthesia Checklist: Patient identified, Emergency Drugs available, Suction available and Patient being monitored Patient Re-evaluated:Patient Re-evaluated prior to induction Oxygen Delivery Method: Circle System Utilized Preoxygenation: Pre-oxygenation with 100% oxygen Induction Type: IV induction, Rapid sequence and Cricoid Pressure applied Ventilation: Mask ventilation without difficulty Laryngoscope Size: Mac and 4 Grade View: Grade I Tube type: Oral Tube size: 7.5 mm Number of attempts: 1 Airway Equipment and Method: Stylet and Oral airway Placement Confirmation: ETT inserted through vocal cords under direct vision,  positive ETCO2 and breath sounds checked- equal and bilateral Secured at: 23 cm Tube secured with: Tape Dental Injury: Teeth and Oropharynx as per pre-operative assessment
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
## 2018-08-15 NOTE — Anesthesia Procedure Notes (Signed)
Arterial Line Insertion Start/End12/26/2019 4:56 AM Performed by: Dorris SinghGreen, Charlene, MD, anesthesiologist  Patient location: OR. Preanesthetic checklist: patient identified, IV checked, site marked, risks and benefits discussed, surgical consent, monitors and equipment checked, pre-op evaluation, timeout performed and anesthesia consent Right, radial was placed Catheter size: 20 G Hand hygiene performed  and maximum sterile barriers used   Attempts: 1 Procedure performed without using ultrasound guided technique. Following insertion, dressing applied. Post procedure assessment: normal  Patient tolerated the procedure well with no immediate complications.

## 2018-08-15 NOTE — Transfer of Care (Signed)
Immediate Anesthesia Transfer of Care Note  Patient: Wayne Pierce  Procedure(s) Performed: EXPLORATORY LAPAROTOMY WITH PARTIAL COLECTOMY (N/A Abdomen)  Patient Location: PACU  Anesthesia Type:General  Level of Consciousness: drowsy  Airway & Oxygen Therapy: Patient Spontanous Breathing and Patient connected to face mask  Post-op Assessment: Report given to RN, Post -op Vital signs reviewed and stable and Patient moving all extremities X 4  Post vital signs: Reviewed and stable  Last Vitals:  Vitals Value Taken Time  BP    Temp    Pulse    Resp    SpO2      Last Pain:  Vitals:   08/15/18 0340  TempSrc:   PainSc: 8          Complications: No apparent anesthesia complications
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
## 2018-08-15 NOTE — Progress Notes (Signed)
Wasted 11 mL of dilaudid PCA with Butch PennyNick RN during syringe change.

## 2018-08-16 ENCOUNTER — Encounter (HOSPITAL_COMMUNITY): Payer: Self-pay | Admitting: General Surgery

## 2018-08-16 LAB — CBC
HEMATOCRIT: 41.3 % (ref 39.0–52.0)
Hemoglobin: 14 g/dL (ref 13.0–17.0)
MCH: 30.2 pg (ref 26.0–34.0)
MCHC: 33.9 g/dL (ref 30.0–36.0)
MCV: 89 fL (ref 80.0–100.0)
Platelets: 217 10*3/uL (ref 150–400)
RBC: 4.64 MIL/uL (ref 4.22–5.81)
RDW: 12.8 % (ref 11.5–15.5)
WBC: 11.1 10*3/uL — ABNORMAL HIGH (ref 4.0–10.5)
nRBC: 0 % (ref 0.0–0.2)

## 2018-08-16 LAB — TYPE AND SCREEN
ABO/RH(D): A POS
Antibody Screen: NEGATIVE
Unit division: 0
Unit division: 0
Unit division: 0
Unit division: 0

## 2018-08-16 LAB — BASIC METABOLIC PANEL
Anion gap: 13 (ref 5–15)
BUN: 14 mg/dL (ref 6–20)
CO2: 24 mmol/L (ref 22–32)
Calcium: 8.6 mg/dL — ABNORMAL LOW (ref 8.9–10.3)
Chloride: 99 mmol/L (ref 98–111)
Creatinine, Ser: 1.07 mg/dL (ref 0.61–1.24)
GFR calc Af Amer: >60 mL/min (ref >60)
GFR calc non Af Amer: >60 mL/min (ref >60)
Glucose, Bld: 113 mg/dL — ABNORMAL HIGH (ref 70–99)
Potassium: 4.4 mmol/L (ref 3.5–5.1)
Sodium: 136 mmol/L (ref 135–145)

## 2018-08-16 LAB — BPAM RBC
BLOOD PRODUCT EXPIRATION DATE: 201912302359
Blood Product Expiration Date: 201912302359
Blood Product Expiration Date: 202001102359
Blood Product Expiration Date: 202001102359
ISSUE DATE / TIME: 201912260334
ISSUE DATE / TIME: 201912260334
ISSUE DATE / TIME: 201912260423
ISSUE DATE / TIME: 201912260423
Unit Type and Rh: 9500
Unit Type and Rh: 9500
Unit Type and Rh: 9500
Unit Type and Rh: 9500

## 2018-08-16 LAB — BPAM FFP
BLOOD PRODUCT EXPIRATION DATE: 201912262359
Blood Product Expiration Date: 201912262359
ISSUE DATE / TIME: 201912260335
ISSUE DATE / TIME: 201912260335
UNIT TYPE AND RH: 6200
Unit Type and Rh: 6200

## 2018-08-16 LAB — PREPARE FRESH FROZEN PLASMA
Unit division: 0
Unit division: 0

## 2018-08-16 LAB — HIV ANTIBODY (ROUTINE TESTING W REFLEX): HIV Screen 4th Generation wRfx: NONREACTIVE

## 2018-08-16 MED ORDER — PIPERACILLIN-TAZOBACTAM 3.375 G IVPB
3.3750 g | Freq: Three times a day (TID) | INTRAVENOUS | Status: AC
  Administered 2018-08-16 – 2018-08-19 (×9): 3.375 g via INTRAVENOUS
  Filled 2018-08-16 (×8): qty 50
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
## 2018-08-16 NOTE — Progress Notes (Deleted)
Pt discharged going to [facility], given motrin for pain, BP 167/57 alert and responsive, accompanied by PTAR, SNF aware about the foley cath.
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
## 2018-08-17 MED ORDER — KCL IN DEXTROSE-NACL 20-5-0.45 MEQ/L-%-% IV SOLN
INTRAVENOUS | Status: DC
  Administered 2018-08-17 – 2018-08-18 (×2): via INTRAVENOUS
  Filled 2018-08-17 (×2): qty 1000

## 2018-08-17 MED ORDER — ACETAMINOPHEN 500 MG PO TABS
1000.0000 mg | ORAL_TABLET | Freq: Four times a day (QID) | ORAL | Status: DC
  Administered 2018-08-17 – 2018-08-18 (×4): 1000 mg via ORAL
  Filled 2018-08-17 (×5): qty 2

## 2018-08-17 MED ORDER — METHOCARBAMOL 500 MG PO TABS
500.0000 mg | ORAL_TABLET | Freq: Three times a day (TID) | ORAL | Status: DC
  Administered 2018-08-17 (×3): 500 mg via ORAL
  Filled 2018-08-17 (×3): qty 1
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
## 2018-08-18 ENCOUNTER — Inpatient Hospital Stay (HOSPITAL_COMMUNITY): Payer: Self-pay

## 2018-08-18 LAB — BASIC METABOLIC PANEL
Anion gap: 12 (ref 5–15)
BUN: 13 mg/dL (ref 6–20)
CO2: 28 mmol/L (ref 22–32)
CREATININE: 1.39 mg/dL — AB (ref 0.61–1.24)
Calcium: 9.3 mg/dL (ref 8.9–10.3)
Chloride: 96 mmol/L — ABNORMAL LOW (ref 98–111)
GFR calc non Af Amer: >60 mL/min (ref >60)
Glucose, Bld: 129 mg/dL — ABNORMAL HIGH (ref 70–99)
Potassium: 4.4 mmol/L (ref 3.5–5.1)
Sodium: 136 mmol/L (ref 135–145)

## 2018-08-18 LAB — CBC
HCT: 51.2 % (ref 39.0–52.0)
Hemoglobin: 17 g/dL (ref 13.0–17.0)
MCH: 29.8 pg (ref 26.0–34.0)
MCHC: 33.2 g/dL (ref 30.0–36.0)
MCV: 89.8 fL (ref 80.0–100.0)
Platelets: 243 K/uL (ref 150–400)
RBC: 5.7 MIL/uL (ref 4.22–5.81)
RDW: 12.3 % (ref 11.5–15.5)
WBC: 14.2 K/uL — ABNORMAL HIGH (ref 4.0–10.5)
nRBC: 0 % (ref 0.0–0.2)

## 2018-08-18 MED ORDER — ACETAMINOPHEN 10 MG/ML IV SOLN
1000.0000 mg | Freq: Four times a day (QID) | INTRAVENOUS | Status: AC
  Administered 2018-08-18 – 2018-08-19 (×4): 1000 mg via INTRAVENOUS
  Filled 2018-08-18 (×4): qty 100

## 2018-08-18 MED ORDER — METHOCARBAMOL 1000 MG/10ML IJ SOLN
500.0000 mg | Freq: Three times a day (TID) | INTRAVENOUS | Status: DC
  Administered 2018-08-18 – 2018-08-19 (×3): 500 mg via INTRAVENOUS
  Filled 2018-08-18 (×2): qty 5
  Filled 2018-08-18: qty 500
  Filled 2018-08-18: qty 5

## 2018-08-18 MED ORDER — POTASSIUM CHLORIDE IN NACL 20-0.9 MEQ/L-% IV SOLN
INTRAVENOUS | Status: DC
  Administered 2018-08-18 (×2): via INTRAVENOUS
  Filled 2018-08-18 (×3): qty 1000
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
## 2018-08-19 ENCOUNTER — Inpatient Hospital Stay (HOSPITAL_COMMUNITY): Payer: Self-pay

## 2018-08-19 LAB — CBC
HCT: 46 % (ref 39.0–52.0)
Hemoglobin: 15.6 g/dL (ref 13.0–17.0)
MCH: 30.5 pg (ref 26.0–34.0)
MCHC: 33.9 g/dL (ref 30.0–36.0)
MCV: 90 fL (ref 80.0–100.0)
Platelets: 267 K/uL (ref 150–400)
RBC: 5.11 MIL/uL (ref 4.22–5.81)
RDW: 12.2 % (ref 11.5–15.5)
WBC: 11.9 K/uL — ABNORMAL HIGH (ref 4.0–10.5)
nRBC: 0 % (ref 0.0–0.2)

## 2018-08-19 LAB — BASIC METABOLIC PANEL
Anion gap: 10 (ref 5–15)
BUN: 17 mg/dL (ref 6–20)
CHLORIDE: 100 mmol/L (ref 98–111)
CO2: 26 mmol/L (ref 22–32)
Calcium: 8.5 mg/dL — ABNORMAL LOW (ref 8.9–10.3)
Creatinine, Ser: 1.37 mg/dL — ABNORMAL HIGH (ref 0.61–1.24)
GFR calc Af Amer: >60 mL/min (ref >60)
GFR calc non Af Amer: >60 mL/min (ref >60)
Glucose, Bld: 108 mg/dL — ABNORMAL HIGH (ref 70–99)
Potassium: 5.1 mmol/L (ref 3.5–5.1)
Sodium: 136 mmol/L (ref 135–145)

## 2018-08-19 MED ORDER — OXYCODONE HCL 5 MG PO TABS
5.0000 mg | ORAL_TABLET | ORAL | Status: DC | PRN
  Administered 2018-08-19 – 2018-08-25 (×16): 10 mg via ORAL
  Filled 2018-08-19 (×17): qty 2

## 2018-08-19 MED ORDER — SODIUM CHLORIDE 0.9 % IV BOLUS
1000.0000 mL | Freq: Once | INTRAVENOUS | Status: AC
  Administered 2018-08-19: 1000 mL via INTRAVENOUS

## 2018-08-19 MED ORDER — HYDROMORPHONE HCL 1 MG/ML IJ SOLN
1.0000 mg | INTRAMUSCULAR | Status: DC | PRN
  Administered 2018-08-19 – 2018-08-20 (×2): 2 mg via INTRAVENOUS
  Filled 2018-08-19 (×2): qty 2

## 2018-08-19 MED ORDER — ACETAMINOPHEN 10 MG/ML IV SOLN
1000.0000 mg | Freq: Four times a day (QID) | INTRAVENOUS | Status: AC
  Administered 2018-08-19 – 2018-08-20 (×3): 1000 mg via INTRAVENOUS
  Filled 2018-08-19 (×4): qty 100

## 2018-08-19 MED ORDER — METHOCARBAMOL 1000 MG/10ML IJ SOLN
1000.0000 mg | Freq: Three times a day (TID) | INTRAVENOUS | Status: DC
  Administered 2018-08-19 – 2018-08-22 (×9): 1000 mg via INTRAVENOUS
  Filled 2018-08-19 (×10): qty 10

## 2018-08-19 MED ORDER — SODIUM CHLORIDE 0.9 % IV SOLN
INTRAVENOUS | Status: DC
  Administered 2018-08-19 – 2018-08-23 (×10): via INTRAVENOUS

## 2018-08-19 NOTE — Progress Notes (Signed)
Wasted approximately 2.75 ml of dilaudid with Antionette FairyWoody RN  At syringe exchange this afternoon.
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
## 2018-08-19 NOTE — Progress Notes (Signed)
CSW attempted to complete an SBIRT with the patient. In the room was the nurse and another visitor. Patient stated that he was in a lot of pain and asked if CSW would come back later.   CSW will attempted to complete and assessment later.   Drucilla Schmidtaitlin Paiden Caraveo, MSW, LCSW-A Clinical Social Worker [facility]
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
## 2018-08-19 NOTE — Plan of Care (Signed)
  Problem: Education: Goal: Knowledge of General Education information will improve Description Including pain rating scale, medication(s)/side effects and non-pharmacologic comfort measures Outcome: Progressing   Problem: Clinical Measurements: Goal: Ability to maintain clinical measurements within normal limits will improve Outcome: Progressing Goal: Will remain free from infection Outcome: Progressing Goal: Respiratory complications will improve Outcome: Progressing   Problem: Activity: Goal: Risk for activity intolerance will decrease Outcome: Progressing   Problem: Pain Managment: Goal: General experience of comfort will improve Outcome: Progressing   Problem: Skin Integrity: Goal: Demonstration of wound healing without infection will improve Outcome: Progressing

## 2018-08-19 NOTE — Consult Note (Signed)
[place] Nurse ostomy consult note Met with patient (he is sleeping, will come back to work with girlfriend and mother) Stoma type/location: LLQ, end colostomy Stomal assessment/size: 1 1/2" x 2" slightly oval shaped, edematous, ruddy, budded from skin  Peristomal assessment: intact  Treatment options for stomal/peristomal skin: 2" barrier ring Output: bloody, patient self reports flatus Ostomy pouching: 2 3/4" CTF pouching system. 2" barrier ring Explained to patient and mother indigent program may not cover all of the items such as beige pouches with filters. Mother works in health care and feels like she can purchase through her work.  Education provided:  Explained role of ostomy nurse and creation of stoma  Explained stoma characteristics (budded, flush, color, texture, care) Demonstrated pouch change (cutting new skin barrier, measuring stoma, cleaning peristomal skin and stoma, use of barrier ring) Education on emptying when 1/3 to 1/2 full and how to empty Demonstrated "burping" flatus from pouch Demonstrated use of wick to clean spout  Discussed risk of peristomal hernia  Enrolled patient in [employer] program: Yes 2 3/4" beige pouches with filter, 2" barrier ring requested.   [place] Nurse will follow along with you for continued support with ostomy teaching and care Arma MSN, RN, [place], [place], [place]
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
## 2018-08-19 NOTE — Plan of Care (Signed)
  Problem: Coping: Goal: Level of anxiety will decrease Outcome: Progressing   Problem: Pain Managment: Goal: General experience of comfort will improve Outcome: Progressing   Problem: Safety: Goal: Ability to remain free from injury will improve Outcome: Progressing   Problem: Activity: Goal: Risk for activity intolerance will decrease Outcome: Progressing

## 2018-08-19 NOTE — Progress Notes (Signed)
PT Cancellation Note  Patient Details Name: Wayne Pierce MRN: [id number] DOB: [birth year]   Cancelled Treatment:    Reason Eval/Treat Not Completed: Patient declined, no reason specified Pt declined participating in therapy at this time and reports just getting back to bed from sitting in recliner for ~2 hours. Pt reports vomiting while up and that RN is aware. PT will continue to follow acutely.    Wayne Pierce, PTA Acute Rehabilitation Services Pager: [phone] Office: [phone]   08/19/2018, 9:27 AM

## 2018-08-19 NOTE — Progress Notes (Signed)
Pt's dilaudid was discontinued this afternoon - wasted 26 ml with Elliot GurneyWoody RN Received orders from PA for oral oxycodone and IV push dilaudid -  Pt continued to have increase pain to abdomin and wanted to have NG tube placed. I spoke to MD trauma - Dr. Sheliah HatchKinsinger and received orders to place NG tube to low intermittent suction, per the request of the Pt.  He felt it would help with the decompressing of his stomach.  As NG tube was place, the Pt vomited approximately 350 ml of green substance.  NG tube verified by Xray, and pt placed on Intermittent suction, still c/o increase pain and discomfort, and discomfort to his throat.  I explained that the decompressing would take some time to occur,  Pt stated that he could not stand having the NG tube any long, and pulled the NG tube out after 2 hours placement.

## 2018-08-20 LAB — BASIC METABOLIC PANEL
Anion gap: 10 (ref 5–15)
BUN: 13 mg/dL (ref 6–20)
CALCIUM: 8.3 mg/dL — AB (ref 8.9–10.3)
CO2: 25 mmol/L (ref 22–32)
CREATININE: 1.08 mg/dL (ref 0.61–1.24)
Chloride: 101 mmol/L (ref 98–111)
GFR calc Af Amer: >60 mL/min (ref >60)
GFR calc non Af Amer: >60 mL/min (ref >60)
Glucose, Bld: 96 mg/dL (ref 70–99)
Potassium: 4 mmol/L (ref 3.5–5.1)
Sodium: 136 mmol/L (ref 135–145)

## 2018-08-20 LAB — CBC
HCT: 39 % (ref 39.0–52.0)
Hemoglobin: 13.3 g/dL (ref 13.0–17.0)
MCH: 30.8 pg (ref 26.0–34.0)
MCHC: 34.1 g/dL (ref 30.0–36.0)
MCV: 90.3 fL (ref 80.0–100.0)
Platelets: 254 10*3/uL (ref 150–400)
RBC: 4.32 MIL/uL (ref 4.22–5.81)
RDW: 12.3 % (ref 11.5–15.5)
WBC: 9.1 10*3/uL (ref 4.0–10.5)
nRBC: 0 % (ref 0.0–0.2)

## 2018-08-20 MED ORDER — ONDANSETRON HCL 4 MG/2ML IJ SOLN: 4.0000 mg | INTRAMUSCULAR | Status: DC | PRN
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
## 2018-08-20 NOTE — Progress Notes (Signed)
08/20/2018:  CSW completed SBIRT screening with the patient. Patient states that he does not drink or have a drinking problem. Patient scored 0 in all categories, no intervention is required at this time.   CSW also asked the patient of the address where he would be discharged too. He stated that he will be staying with his girlfriend in [place]. She was present at bedside with the patient.   Her address is [address], [address], [place], [place], [id number].   CSW will continue to follow until the patient is medically stable to discharge.   Drucilla Schmidtaitlin Caoimhe Damron, MSW, LCSW-A Clinical Social Worker [facility]

## 2018-08-21 MED ORDER — SIMETHICONE 80 MG PO CHEW
80.0000 mg | CHEWABLE_TABLET | Freq: Four times a day (QID) | ORAL | Status: DC | PRN
  Administered 2018-08-21 – 2018-08-23 (×5): 80 mg via ORAL
  Filled 2018-08-21 (×5): qty 1

## 2018-08-21 MED ORDER — ACETAMINOPHEN 500 MG PO TABS
1000.0000 mg | ORAL_TABLET | Freq: Four times a day (QID) | ORAL | Status: DC
  Administered 2018-08-21 – 2018-08-25 (×16): 1000 mg via ORAL
  Filled 2018-08-21 (×16): qty 2

## 2018-08-21 MED ORDER — GABAPENTIN 100 MG PO CAPS
200.0000 mg | ORAL_CAPSULE | Freq: Three times a day (TID) | ORAL | Status: DC
  Administered 2018-08-21 – 2018-08-25 (×13): 200 mg via ORAL
  Filled 2018-08-21 (×13): qty 2

## 2018-08-21 NOTE — Consult Note (Signed)
WOC nurse in to see patient for assessment of current pouching and ostomy function.  Patient up walking in the hall.  Girlfriend reports pouch doing well. Patient verified same.   No other needs at this time.  WOC nurse will follow up for additional teaching visit Thursday 08/22/18  Wayne Pierce [facility], CNS, [employer] [phone]
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
## 2018-08-22 LAB — CBC
HCT: 38.1 % — ABNORMAL LOW (ref 39.0–52.0)
Hemoglobin: 13.1 g/dL (ref 13.0–17.0)
MCH: 30.7 pg (ref 26.0–34.0)
MCHC: 34.4 g/dL (ref 30.0–36.0)
MCV: 89.2 fL (ref 80.0–100.0)
Platelets: 315 10*3/uL (ref 150–400)
RBC: 4.27 MIL/uL (ref 4.22–5.81)
RDW: 12.3 % (ref 11.5–15.5)
WBC: 9.8 10*3/uL (ref 4.0–10.5)
nRBC: 0 % (ref 0.0–0.2)

## 2018-08-22 LAB — BASIC METABOLIC PANEL
Anion gap: 11 (ref 5–15)
BUN: 10 mg/dL (ref 6–20)
CO2: 25 mmol/L (ref 22–32)
Calcium: 8.7 mg/dL — ABNORMAL LOW (ref 8.9–10.3)
Chloride: 101 mmol/L (ref 98–111)
Creatinine, Ser: 1.08 mg/dL (ref 0.61–1.24)
GFR calc Af Amer: >60 mL/min (ref >60)
GFR calc non Af Amer: >60 mL/min (ref >60)
Glucose, Bld: 76 mg/dL (ref 70–99)
Potassium: 4.3 mmol/L (ref 3.5–5.1)
Sodium: 137 mmol/L (ref 135–145)

## 2018-08-22 MED ORDER — BOOST / RESOURCE BREEZE PO LIQD CUSTOM
1.0000 | Freq: Three times a day (TID) | ORAL | Status: DC
  Administered 2018-08-22 – 2018-08-24 (×6): 1 via ORAL
  Administered 2018-08-24: 15:00:00 via ORAL
  Administered 2018-08-24: 1 via ORAL

## 2018-08-22 MED ORDER — HYDROMORPHONE HCL 1 MG/ML IJ SOLN: 1.0000 mg | INTRAMUSCULAR | Status: DC | PRN

## 2018-08-22 MED ORDER — DOCUSATE SODIUM 100 MG PO CAPS
100.0000 mg | ORAL_CAPSULE | Freq: Every day | ORAL | Status: DC
  Administered 2018-08-22 – 2018-08-24 (×3): 100 mg via ORAL
  Filled 2018-08-22 (×4): qty 1

## 2018-08-22 MED ORDER — METHOCARBAMOL 500 MG PO TABS
1000.0000 mg | ORAL_TABLET | Freq: Three times a day (TID) | ORAL | Status: DC
  Administered 2018-08-22 – 2018-08-25 (×10): 1000 mg via ORAL
  Filled 2018-08-22 (×11): qty 2
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
## 2018-08-22 NOTE — Consult Note (Signed)
WOC Nurse ostomy follow up Stoma type/location: LLQ, end colostomy Stomal assessment/size: 1 3/4" x 2" oval, budded, red Peristomal assessment: NA Treatment options for stomal/peristomal skin: using 2" barrier ring to aid in seal Output  Bloody, +flatus per patient Ostomy pouching: 2pc. 2 3/4" pouching system with 2" barrier ring Education provided:  Allowed patient to remove old pouch, he cleaned his peristomal skin with encouragement WOC nurse discussed shaving the peristomal skin with the grain of the hair to avoid folliculitis. Demonstrated with patient's girlfriends razor.  Patient's girlfriend cut new skin barrier, placed ostomy barrier ring, placed skin barrier, attached new pouch to the skin barrier without difficultly Patient is independent with lock and roll closure Provided indigent paperwork earlier this week and encouraged today to make sure they enroll. Item numbers have been written down for the mother for private purchase (she works in a medical office)  Enrolled patient in [employer] Discharge program: Yes  Extra supplies ordered for patient, pending possible DC over the weekend. 5 pouching systems, 4 barrier rings.  Unclear if patient will have HHRN or not.    WOC Nurse will follow along with you for continued support with ostomy teaching and care Aubery Date [facility], RN, [place], CNS, [place] [phone]

## 2018-08-23 MED ORDER — PANTOPRAZOLE SODIUM 40 MG PO TBEC
40.0000 mg | DELAYED_RELEASE_TABLET | Freq: Every day | ORAL | Status: DC
  Administered 2018-08-23 – 2018-08-24 (×2): 40 mg via ORAL
  Filled 2018-08-23 (×2): qty 1

## 2018-08-23 NOTE — Progress Notes (Signed)
Spoke to [employer] this AM, OK to leave PIV out at this time, as long as the Pt has good oral intake.

## 2018-08-23 NOTE — Consult Note (Signed)
WOC nurse by to assess for any needs. Patient with pouch intact, no needs identified. Patient on the phone, supplies in the room   WOC Nurse will follow along with you for continued support with ostomy teaching and care Wayne Pierce [facility]  LLCustin MSN, RN, [place], CNS, [place] [phone]
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
## 2018-08-23 NOTE — Discharge Instructions (Signed)
[facility]      [facility] °[phone] ° °OPEN ABDOMINAL SURGERY: POST OP INSTRUCTIONS ° °Always review your discharge instruction sheet given to you by the facility where your surgery was performed. ° °IF YOU HAVE DISABILITY OR FAMILY LEAVE FORMS, YOU MUST BRING THEM TO THE OFFICE FOR PROCESSING.  PLEASE DO NOT GIVE THEM TO YOUR DOCTOR. ° °1. A prescription for pain medication may be given to you upon discharge.  Take your pain medication as prescribed, if needed.  If narcotic pain medicine is not needed, then you may take acetaminophen (Tylenol) or ibuprofen (Advil) as needed. °2. Take your usually prescribed medications unless otherwise directed. °3. If you need a refill on your pain medication, please contact your pharmacy. They will contact our office to request authorization.  Prescriptions will not be filled after 5pm or on week-ends. °4. You should follow a light diet the first few days after arrival home, such as soup and crackers, pudding, etc.unless your doctor has advised otherwise. A high-fiber, low fat diet can be resumed as tolerated.   Be sure to include lots of fluids daily. Most patients will experience some swelling and bruising on the chest and neck area.  Ice packs will help.  Swelling and bruising can take several days to resolve °5. Most patients will experience some swelling and bruising in the area of the incision. Ice pack will help. Swelling and bruising can take several days to resolve..  °6. It is common to experience some constipation if taking pain medication after surgery.  Increasing fluid intake and taking a stool softener will usually help or prevent this problem from occurring.  A mild laxative (Milk of Magnesia or Miralax) should be taken according to package directions if there are no bowel movements after 48 hours. °7.  You may have steri-strips (small skin tapes) in place directly over the incision.  These strips should be left on the skin for 7-10 days.  If your  surgeon used skin glue on the incision, you may shower in 24 hours.  The glue will flake off over the next 2-3 weeks.  Any sutures or staples will be removed at the office during your follow-up visit. You may find that a light gauze bandage over your incision may keep your staples from being rubbed or pulled. You may shower and replace the bandage daily. °8. ACTIVITIES:  You may resume regular (light) daily activities beginning the next day--such as daily self-care, walking, climbing stairs--gradually increasing activities as tolerated.  You may have sexual intercourse when it is comfortable.  Refrain from any heavy lifting or straining until approved by your doctor. °a. You may drive when you no longer are taking prescription pain medication, you can comfortably wear a seatbelt, and you can safely maneuver your car and apply brakes °b. Return to Work: ___________________________________ °9. You should see your doctor in the office for a follow-up appointment approximately two weeks after your surgery.  Make sure that you call for this appointment within a day or two after you arrive home to insure a convenient appointment time. °OTHER INSTRUCTIONS:  °_____________________________________________________________ °_____________________________________________________________ ° °WHEN TO CALL YOUR DOCTOR: °1. Fever over 101.0 °2. Inability to urinate °3. Nausea and/or vomiting °4. Extreme swelling or bruising °5. Continued bleeding from incision. °6. Increased pain, redness, or drainage from the incision. °7. Difficulty swallowing or breathing °8. Muscle cramping or spasms. °9. Numbness or tingling in hands or feet or around lips. ° °The clinic staff is available to   answer your questions during regular business hours.  Please dont hesitate to call and ask to speak to one of the nurses if you have concerns.  For further questions, please visit [email or web address]   Colostomy Home Guide,  Adult  Colostomy surgery is done to create an opening in the front of the abdomen for stool (feces) to leave the body through an ostomy (stoma). Part of the large intestine is attached to the stoma. A bag, also called a pouch, is fitted over the stoma. Stool and gas will collect in the bag. After surgery, you will need to empty and change your colostomy bag as needed. You will also need to care for your stoma. How to care for the stoma Your stoma should look pink, red, and moist, like the inside of your cheek. Soon after surgery, the stoma may be swollen, but this swelling will go away within 6 weeks. To care for the stoma:  Keep the skin around the stoma clean and dry.  Use a clean, soft washcloth to gently wash the stoma and the skin around it. Clean using a circular motion, and wipe away from the stoma opening, not toward it. ? Use warm water and only use cleansers recommended by your health care provider. ? Rinse the stoma area with plain water. ? Dry the area around the stoma well.  Use stoma powder or ointment on your skin only as told by your health care provider. Do not use any other powders, gels, wipes, or creams on the skin around the stoma.  Check the stoma area every day for signs of infection. Check for: ? New or worsening redness, swelling, or pain. ? New or increased fluid or blood. ? Pus or warmth.  Measure the stoma opening regularly and record the size. Watch for changes. (It is normal for the stoma to get smaller as swelling goes away.) Share this information with your health care provider. How to empty the colostomy bag  Empty your bag at bedtime and whenever it is one-third to one-half full. Do not let the bag get more than half-full with stool or gas. The bag could leak if it gets too full. Some colostomy bags have a built-in gas release valve that releases gas often throughout the day. Follow these basic steps: 1. Wash your hands with soap and water. 2. Sit far back  on the toilet seat. 3. Put several pieces of toilet paper into the toilet water. This will prevent splashing as you empty stool into the toilet. 4. Remove the clip or the hook-and-loop fastener from the tail end of the bag. 5. Unroll the tail, then empty the stool into the toilet. 6. Clean the tail with toilet paper or a moist towelette. 7. Reroll the tail, and close it with the clip or the hook-and-loop fastener. 8. Wash your hands again. How to change the colostomy bag Change your bag every 3-4 days or as often as told by your health care provider. Also change the bag if it is leaking or separating from the skin, or if your skin around the stoma looks or feels irritated. Irritated skin may be a sign that the bag is leaking. Always have colostomy supplies with you, and follow these basic steps: 1. Wash your hands with soap and water. Have paper towels or tissues nearby to clean any discharge. 2. Remove the old bag and skin barrier. Use your fingers or a warm cloth to gently push the skin away from the barrier. 3. Clean the  stoma area with water or with mild soap and water, as directed. Use water to rinse away any soap. 4. Dry the skin. You may use the cool setting on a hair dryer to do this. 5. Use a tracing pattern (template) to cut the skin barrier to the size needed. 6. If you are using a two-piece bag, attach the bag and the skin barrier to each other. Add the barrier ring, if you use one. 7. If directed, apply stoma powder or skin barrier gel to the skin. 8. Warm the skin barrier with your hands, or blow with a hair dryer for 5-10 seconds. 9. Remove the paper from the adhesive strip of the skin barrier. 10. Press the adhesive strip onto the skin around the stoma. 11. Gently rub the skin barrier onto the skin. This creates heat that helps the barrier to stick. 12. Apply stoma tape to the edges of the skin barrier, if desired. 13. Wash your hands again. General recommendations  Avoid  wearing tight clothes or having anything press directly on your stoma or bag. Change your clothing whenever it is soiled or damp.  You may shower or bathe with the bag on or off. Do not use harsh or oily soaps or lotions. Dry the skin and bag after bathing.  Store all supplies in a cool, dry place. Do not leave supplies in extreme heat because some parts can melt or not stick as well.  Whenever you leave home, take extra clothing and an extra skin barrier and bag with you.  If your bag gets wet, you can dry it with a hair dryer on the cool setting.  To prevent odor, you may put drops of ostomy deodorizer in the bag.  If recommended by your health care provider, put ostomy lubricant inside the bag. This helps stool to slide out of the bag more easily and completely. Contact a health care provider if:  You have new or worsening redness, swelling, or pain around your stoma.  You have new or increased fluid or blood coming from your stoma.  Your stoma feels warm to the touch.  You have pus coming from your stoma.  Your stoma extends in or out farther than normal.  You need to change your bag every day.  You have a fever. Get help right away if:  Your stool is bloody.  You have nausea or you vomit.  You have trouble breathing. Summary  Measure your stoma opening regularly and record the size. Watch for changes.  Empty your bag at bedtime and whenever it is one-third to one-half full. Do not let the bag get more than half-full with stool or gas.  Change your bag every 3-4 days or as often as told by your health care provider.  Whenever you leave home, take extra clothing and an extra skin barrier and bag with you. This information is not intended to replace advice given to you by your health care provider. Make sure you discuss any questions you have with your health care provider. Document Released: 08/10/2003 Document Revised: 04/12/2018 Document Reviewed:  01/31/2017 Elsevier Interactive Patient Education  2019 [employer]  MIDLINE WOUND CARE: - midline dressing to be changed twice daily - supplies: sterile saline, kerlix, scissors, ABD pads, tape  - remove dressing and all packing carefully, moistening with sterile saline as needed to avoid packing/internal dressing sticking to the wound. - clean edges of skin around the wound with water/gauze, making sure there is no tape debris or  leakage left on skin that could cause skin irritation or breakdown. - dampen and clean kerlix with sterile saline and pack wound from wound base to skin level, making sure to take note of any possible areas of wound tracking, tunneling and packing appropriately. Wound can be packed loosely. Trim kerlix to size if a whole kerlix is not required. - cover wound with a dry ABD pad and secure with tape.  - write the date/time on the dry dressing/tape to better track when the last dressing change occurred. - apply any skin protectant/powder recommended by clinician to protect skin/skin folds. - change dressing as needed if leakage occurs, wound gets contaminated, or patient requests to shower. - patient may shower daily with wound open and following the shower the wound should be dried and a clean dressing placed.
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
## 2018-08-23 NOTE — Progress Notes (Signed)
Patient refused dressing change this morning, patient will do own dressing change.
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
## 2018-08-24 NOTE — Care Management (Addendum)
Discussed with Jermaine from [facility] and with patient.  Pt requests a cane instead of a RW and a 3n1.  DME cane and 3n1 ordered and will be delivered today by [facility].  Pt plans to d/c tomorrow.  [facility] is set to provided [facility] PT, OT, RN and CSW to assist with insurance needs.

## 2018-08-25 ENCOUNTER — Encounter: Payer: Self-pay | Admitting: Student

## 2018-08-25 DIAGNOSIS — S3210XB Unspecified fracture of sacrum, initial encounter for open fracture: Secondary | ICD-10-CM | POA: Insufficient documentation

## 2018-08-25 MED ORDER — METHOCARBAMOL 500 MG PO TABS: 500.0000 mg | ORAL_TABLET | Freq: Three times a day (TID) | ORAL | 1 refills | Status: DC

## 2018-08-25 MED ORDER — OXYCODONE HCL 5 MG PO TABS: 5.0000 mg | ORAL_TABLET | Freq: Four times a day (QID) | ORAL | 0 refills | Status: AC | PRN
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
## 2018-08-25 NOTE — Plan of Care (Signed)
  Problem: Education: Goal: Knowledge of General Education information will improve Description Including pain rating scale, medication(s)/side effects and non-pharmacologic comfort measures Outcome: Completed/Met   Problem: Activity: Goal: Risk for activity intolerance will decrease Outcome: Completed/Met   Problem: Coping: Goal: Level of anxiety will decrease Outcome: Completed/Met   Problem: Pain Managment: Goal: General experience of comfort will improve Outcome: Completed/Met   Problem: Education: Goal: Required Educational Video(s) Outcome: Completed/Met   Problem: Skin Integrity: Goal: Demonstration of wound healing without infection will improve Outcome: Completed/Met   Problem: Activity: Goal: Risk for activity intolerance will decrease Outcome: Completed/Met   Problem: Coping: Goal: Level of anxiety will decrease Outcome: Completed/Met   Problem: Safety: Goal: Ability to remain free from injury will improve Outcome: Completed/Met   Problem: Skin Integrity: Goal: Risk for impaired skin integrity will decrease Outcome: Completed/Met

## 2018-08-26 ENCOUNTER — Encounter (HOSPITAL_COMMUNITY): Payer: Self-pay
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
## 2018-10-29 ENCOUNTER — Other Ambulatory Visit: Payer: Self-pay | Admitting: Surgery

## 2018-10-29 DIAGNOSIS — Z933 Colostomy status: Secondary | ICD-10-CM

## 2018-11-04 ENCOUNTER — Other Ambulatory Visit (HOSPITAL_COMMUNITY): Payer: Self-pay | Admitting: Surgery

## 2018-11-04 DIAGNOSIS — Z933 Colostomy status: Secondary | ICD-10-CM

## 2018-11-07 ENCOUNTER — Encounter (HOSPITAL_COMMUNITY): Payer: Self-pay

## 2018-11-07 ENCOUNTER — Ambulatory Visit (HOSPITAL_COMMUNITY): Admission: RE | Admit: 2018-11-07 | Payer: Self-pay | Source: Ambulatory Visit

## 2018-11-20 ENCOUNTER — Encounter (HOSPITAL_COMMUNITY): Payer: Self-pay | Admitting: Emergency Medicine

## 2018-11-20 ENCOUNTER — Other Ambulatory Visit: Payer: Self-pay

## 2018-11-20 ENCOUNTER — Emergency Department (HOSPITAL_COMMUNITY)
Admission: EM | Admit: 2018-11-20 | Discharge: 2018-11-20 | Disposition: A | Discharge status: Home / Self Care | Payer: Self-pay | Attending: Emergency Medicine | Admitting: Emergency Medicine

## 2018-11-20 DIAGNOSIS — Z79899 Other long term (current) drug therapy: Secondary | ICD-10-CM | POA: Insufficient documentation

## 2018-11-20 DIAGNOSIS — F17200 Nicotine dependence, unspecified, uncomplicated: Secondary | ICD-10-CM | POA: Insufficient documentation

## 2018-11-20 DIAGNOSIS — R1030 Lower abdominal pain, unspecified: Secondary | ICD-10-CM | POA: Insufficient documentation

## 2018-11-20 DIAGNOSIS — Z933 Colostomy status: Secondary | ICD-10-CM | POA: Insufficient documentation

## 2018-11-20 DIAGNOSIS — R109 Unspecified abdominal pain: Secondary | ICD-10-CM

## 2018-11-20 HISTORY — DX: Accidental discharge from unspecified firearms or gun, initial encounter: W34.00XA
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
## 2018-11-20 NOTE — Discharge Instructions (Signed)
Eat a bland diet today to make sure the pain will not come back.  If feeling fine can go back to your normal diet.  If pain worsens and does not go away or nothing out in your bag and start feeling sick return to ER.
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
## 2018-12-10 ENCOUNTER — Ambulatory Visit (HOSPITAL_COMMUNITY): Payer: Self-pay

## 2019-01-14 ENCOUNTER — Other Ambulatory Visit: Payer: Self-pay

## 2019-01-14 ENCOUNTER — Ambulatory Visit (HOSPITAL_COMMUNITY)
Admission: RE | Admit: 2019-01-14 | Discharge: 2019-01-14 | Disposition: A | Discharge status: Home / Self Care | Payer: Self-pay | Source: Ambulatory Visit | Attending: Surgery | Admitting: Surgery

## 2019-01-14 DIAGNOSIS — Z933 Colostomy status: Secondary | ICD-10-CM | POA: Insufficient documentation

## 2019-01-14 MED ORDER — IOHEXOL 300 MG/ML  SOLN
450.0000 mL | Freq: Once | INTRAMUSCULAR | Status: AC | PRN
  Administered 2019-01-14: 100 mL via ORAL

## 2019-02-20 ENCOUNTER — Encounter: Payer: Self-pay | Admitting: Surgery

## 2019-04-09 ENCOUNTER — Telehealth (HOSPITAL_COMMUNITY): Payer: Self-pay

## 2019-04-09 NOTE — Telephone Encounter (Signed)
Patient called today asking about orders for blood work. He is trying to get the colostomy bag removed. Please call him at [phone]  Any question give me a call.  Thanks, Tressia Miners [phone]

## 2019-04-09 NOTE — Telephone Encounter (Signed)
Call patient and he was unsure of whether or not he has an appointment with [facility] to discuss colostomy reversal. I then called [facility] office and patient is scheduled to see Dr. Dema Severin 04/16/19 to discuss colostomy reversal. I have asked our office to reach out to Wayne Pierce and answer any questions regarding what he needs to do prior to appointment.   Brigid Re , [facility] 04/09/2019, 11:14 AM Pager: [phone]

## 2019-05-02 ENCOUNTER — Encounter: Payer: Self-pay | Admitting: Gastroenterology

## 2019-06-03 ENCOUNTER — Ambulatory Visit (INDEPENDENT_AMBULATORY_CARE_PROVIDER_SITE_OTHER): Payer: Self-pay | Admitting: Gastroenterology

## 2019-06-03 ENCOUNTER — Encounter: Payer: Self-pay | Admitting: Gastroenterology

## 2019-06-03 VITALS — BP 112/80 | HR 76 | Temp 98.1°F | Ht 68.5 in | Wt 217.2 lb

## 2019-06-03 DIAGNOSIS — Z933 Colostomy status: Secondary | ICD-10-CM
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
## 2019-06-03 NOTE — Patient Instructions (Signed)
If you are age 30 or older, your body mass index should be between 23-30. Your Body mass index is 32.55 kg/m. If this is out of the aforementioned range listed, please consider follow up with your Primary Care Provider.  If you are age 50 or younger, your body mass index should be between 19-25. Your Body mass index is 32.55 kg/m. If this is out of the aformentioned range listed, please consider follow up with your Primary Care Provider.   We will be in touch with you after speaking with Dr. Dema Severin at [facility].  It was a pleasure to see you today!  Dr. Loletha Carrow

## 2019-06-11 ENCOUNTER — Emergency Department (HOSPITAL_COMMUNITY)
Admission: EM | Admit: 2019-06-11 | Discharge: 2019-06-11 | Disposition: A | Discharge status: Home / Self Care | Payer: Self-pay | Attending: Emergency Medicine | Admitting: Emergency Medicine

## 2019-06-11 ENCOUNTER — Encounter (HOSPITAL_COMMUNITY): Payer: Self-pay

## 2019-06-11 ENCOUNTER — Other Ambulatory Visit: Payer: Self-pay

## 2019-06-11 DIAGNOSIS — Z933 Colostomy status: Secondary | ICD-10-CM | POA: Insufficient documentation

## 2019-06-11 DIAGNOSIS — Z202 Contact with and (suspected) exposure to infections with a predominantly sexual mode of transmission: Secondary | ICD-10-CM | POA: Insufficient documentation

## 2019-06-11 DIAGNOSIS — Z87891 Personal history of nicotine dependence: Secondary | ICD-10-CM | POA: Insufficient documentation

## 2019-06-11 MED ORDER — CEFTRIAXONE SODIUM 250 MG IJ SOLR
250.0000 mg | Freq: Once | INTRAMUSCULAR | Status: AC
  Administered 2019-06-11: 12:00:00 250 mg via INTRAMUSCULAR
  Filled 2019-06-11: qty 250

## 2019-06-11 MED ORDER — AZITHROMYCIN 250 MG PO TABS
1000.0000 mg | ORAL_TABLET | Freq: Once | ORAL | Status: AC
  Administered 2019-06-11: 12:00:00 1000 mg via ORAL
  Filled 2019-06-11: qty 4

## 2019-06-11 MED ORDER — STERILE WATER FOR INJECTION IJ SOLN
INTRAMUSCULAR | Status: AC
  Administered 2019-06-11: 12:00:00 10 mL
  Filled 2019-06-11: qty 10

## 2019-06-11 NOTE — Discharge Instructions (Signed)
You were seen in the emergency department today due to STD exposure. We have tested you for gonorrhea and chlamydia, we have also treated you for these conditions, do not participate in any form of intercourse for a minimum of 7 days, please have your self repeat tested in a week if you have tested positive today.  If positive we will call you.  If positive you will need to inform all sexual partners.  Please follow-up with the health department in 1 week.  Return to the ER for new or worsening symptoms or any other concerns.
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
## 2019-06-11 NOTE — ED Triage Notes (Signed)
Pt reports that he received ac all from a sexual partner that has chlamydia. Pt is asymptomatic at this time.

## 2019-06-12 LAB — GC/CHLAMYDIA PROBE AMP (~~LOC~~) NOT AT ARMC
Chlamydia: POSITIVE — AB
Neisseria Gonorrhea: NEGATIVE

## 2019-06-17 ENCOUNTER — Emergency Department (HOSPITAL_COMMUNITY)
Admission: EM | Admit: 2019-06-17 | Discharge: 2019-06-18 | Disposition: A | Discharge status: Home / Self Care | Payer: Self-pay | Attending: Emergency Medicine | Admitting: Emergency Medicine

## 2019-06-17 ENCOUNTER — Telehealth: Payer: Self-pay | Admitting: Gastroenterology

## 2019-06-17 ENCOUNTER — Encounter (HOSPITAL_COMMUNITY): Payer: Self-pay | Admitting: Emergency Medicine

## 2019-06-17 ENCOUNTER — Other Ambulatory Visit: Payer: Self-pay

## 2019-06-17 DIAGNOSIS — Z202 Contact with and (suspected) exposure to infections with a predominantly sexual mode of transmission: Secondary | ICD-10-CM | POA: Insufficient documentation

## 2019-06-17 DIAGNOSIS — Z87891 Personal history of nicotine dependence: Secondary | ICD-10-CM | POA: Insufficient documentation

## 2019-06-17 MED ORDER — AZITHROMYCIN 250 MG PO TABS
1000.0000 mg | ORAL_TABLET | Freq: Once | ORAL | Status: AC
  Administered 2019-06-18: 1000 mg via ORAL
  Filled 2019-06-17: qty 4

## 2019-06-17 MED ORDER — CEFTRIAXONE SODIUM 250 MG IJ SOLR
250.0000 mg | Freq: Once | INTRAMUSCULAR | Status: AC
  Administered 2019-06-18: 250 mg via INTRAMUSCULAR
  Filled 2019-06-17: qty 250

## 2019-06-17 NOTE — Telephone Encounter (Signed)
Dr. Loletha Carrow please advise. The patient is calling to follow up after your conversation with Dr. Dema Severin from [facility].

## 2019-06-17 NOTE — Telephone Encounter (Signed)
I did not receive a reply from Dr. Dema Severin.  My office note was sent to him, and I will message him again through the electronic chart.

## 2019-06-17 NOTE — ED Triage Notes (Signed)
Patient here from home with complaints of exposure to chlamydia. Seen for same last week.

## 2019-06-17 NOTE — Discharge Instructions (Addendum)
Thank you for allowing me to care for you today in the Emergency Department.   It is very important that you wait 7 full days before having any kind of sexual interaction, this includes oral and anal sex. If you use toys, make sure they have all been sterilized.   Please note- even if you wait the entire 7 days, if you have a partner that has not received treatment, it is possible to keep getting reinfected.   Follow up with the health department for regular STD screening.   Return to the emergency department if you develop discharge from your penis, high fevers, severe abdominal pain, vomiting, or other new, concerning symptoms.
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
## 2019-06-17 NOTE — Telephone Encounter (Signed)
Pt requested an update regarding colonoscopy referral.  As per 06/03/19 notes our office was to contact Dr. Dema Severin from [place].

## 2019-06-17 NOTE — Telephone Encounter (Signed)
FYI- Spoke to the patient who is anxious to have his colostomy reversed. I informed the patient that Dr. Loletha Carrow is awaiting a reply from Dr. Dema Severin and insured the patient that we are aware of his needs and will continue to follow up with [facility] and keep the patient abreast of progress and next steps. This somewhat satisfied the patient. He voiced no further concerns by the conclusion of the call.

## 2019-06-18 ENCOUNTER — Other Ambulatory Visit: Payer: Self-pay | Admitting: *Deleted

## 2019-06-18 ENCOUNTER — Encounter: Payer: Self-pay | Admitting: *Deleted

## 2019-06-18 DIAGNOSIS — Z1159 Encounter for screening for other viral diseases: Secondary | ICD-10-CM

## 2019-06-18 MED ORDER — STERILE WATER FOR INJECTION IJ SOLN
INTRAMUSCULAR | Status: AC
  Administered 2019-06-18: 10 mL
  Filled 2019-06-18: qty 10
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
## 2019-06-18 NOTE — Telephone Encounter (Signed)
Spoke to the patient, alerted the patient concerning all of his upcoming appointments. Patient verbalized understanding. No questions or concerns voiced by the conclusion of the call.

## 2019-06-30 ENCOUNTER — Other Ambulatory Visit: Payer: Self-pay

## 2019-06-30 ENCOUNTER — Telehealth: Payer: Self-pay | Admitting: *Deleted

## 2019-06-30 ENCOUNTER — Ambulatory Visit (AMBULATORY_SURGERY_CENTER): Payer: Self-pay | Admitting: *Deleted

## 2019-06-30 VITALS — Temp 96.7°F | Ht 68.5 in | Wt 218.0 lb

## 2019-06-30 DIAGNOSIS — Z933 Colostomy status: Secondary | ICD-10-CM

## 2019-06-30 MED ORDER — NA SULFATE-K SULFATE-MG SULF 17.5-3.13-1.6 GM/177ML PO SOLN: ORAL | 0 refills | Status: DC

## 2019-06-30 NOTE — Telephone Encounter (Signed)
Dr.Danis,  I'm so sorry your note in the chart for the patient to get Miralax prep before his colonoscopy was missed and in pre-visit today he was given Suprep instructions and Suprep sample kit because he has no insurance. The patient was also told to do the fleets enemas x2  per your request. Is it okay for the patient to go ahead and use the suprep or would you like for me to change him to Miralax. Again I'm so sorry for the mistake.  Thanks for you time, Cashe Gatt pv

## 2019-06-30 NOTE — Telephone Encounter (Signed)
No worries, Suprep is fine since we had a sample.  I had only suggested Miralax for cost since he is uninsured.

## 2019-06-30 NOTE — Progress Notes (Signed)
Patient is here in-person for PV. Patient denies any allergies to eggs or soy. Patient denies any problems with anesthesia/sedation. Patient denies any oxygen use at home. Patient denies taking any diet/weight loss medications or blood thinners. Patient is not being treated for MRSA or C-diff. EMMI education assisgned to the patient for the procedure, this was explained and instructions given to patient. COVID-19 screening test is on 11/20, the pt is aware. Pt is aware that care partner will wait in the car during procedure; if they feel like they will be too hot or cold to wait in the car; they may wait in the 4 th floor lobby. Patient is aware to bring only one care partner. We want them to wear a mask (we do not have any that we can provide them), practice social distancing, and we will check their temperatures when they get here.  I did remind the patient that their care partner needs to stay in the parking lot the entire time and have a cell phone available, we will call them when the pt is ready for discharge. Patient will wear mask into building.    Suprep sample kit given to pt.   Pt also aware to do 2 fleets enemas before coming for procedure.

## 2019-06-30 NOTE — Telephone Encounter (Signed)
Noted. Thanks.

## 2019-07-11 ENCOUNTER — Ambulatory Visit (INDEPENDENT_AMBULATORY_CARE_PROVIDER_SITE_OTHER): Payer: Self-pay

## 2019-07-11 ENCOUNTER — Other Ambulatory Visit: Payer: Self-pay | Admitting: Gastroenterology

## 2019-07-11 DIAGNOSIS — Z1159 Encounter for screening for other viral diseases: Secondary | ICD-10-CM

## 2019-07-14 LAB — SARS CORONAVIRUS 2 (TAT 6-24 HRS): SARS Coronavirus 2: NEGATIVE

## 2019-07-15 ENCOUNTER — Other Ambulatory Visit: Payer: Self-pay

## 2019-07-15 ENCOUNTER — Ambulatory Visit (AMBULATORY_SURGERY_CENTER): Payer: Self-pay | Admitting: Gastroenterology

## 2019-07-15 ENCOUNTER — Encounter: Payer: Self-pay | Admitting: Gastroenterology

## 2019-07-15 VITALS — BP 104/55 | HR 73 | Temp 98.1°F | Resp 18 | Ht 68.5 in | Wt 218.0 lb

## 2019-07-15 DIAGNOSIS — Z933 Colostomy status: Secondary | ICD-10-CM

## 2019-07-15 MED ORDER — SODIUM CHLORIDE 0.9 % IV SOLN: 500.0000 mL | Freq: Once | INTRAVENOUS | Status: DC

## 2019-07-15 NOTE — Patient Instructions (Signed)
YOU HAD AN ENDOSCOPIC PROCEDURE TODAY AT THE [facility]:   Refer to the procedure report that was given to you for any specific questions about what was found during the examination.  If the procedure report does not answer your questions, please call your gastroenterologist to clarify.  If you requested that your care partner not be given the details of your procedure findings, then the procedure report has been included in a sealed envelope for you to review at your convenience later.  YOU SHOULD EXPECT: Some feelings of bloating in the abdomen. Passage of more gas than usual.  Walking can help get rid of the air that was put into your GI tract during the procedure and reduce the bloating. If you had a lower endoscopy (such as a colonoscopy or flexible sigmoidoscopy) you may notice spotting of blood in your stool or on the toilet paper. If you underwent a bowel prep for your procedure, you may not have a normal bowel movement for a few days.  Please Note:  You might notice some irritation and congestion in your nose or some drainage.  This is from the oxygen used during your procedure.  There is no need for concern and it should clear up in a day or so.  SYMPTOMS TO REPORT IMMEDIATELY:   Following lower endoscopy (colonoscopy or flexible sigmoidoscopy):  Excessive amounts of blood in the stool  Significant tenderness or worsening of abdominal pains  Swelling of the abdomen that is new, acute  Fever of 100F or higher  For urgent or emergent issues, a gastroenterologist can be reached at any hour by calling [phone].   DIET:  We do recommend a small meal at first, but then you may proceed to your regular diet.  Drink plenty of fluids but you should avoid alcoholic beverages for 24 hours.  ACTIVITY:  You should plan to take it easy for the rest of today and you should NOT DRIVE or use heavy machinery until tomorrow (because of the sedation medicines used during the test).     FOLLOW UP: Our staff will call the number listed on your records 48-72 hours following your procedure to check on you and address any questions or concerns that you may have regarding the information given to you following your procedure. If we do not reach you, we will leave a message.  We will attempt to reach you two times.  During this call, we will ask if you have developed any symptoms of COVID 19. If you develop any symptoms (ie: fever, flu-like symptoms, shortness of breath, cough etc.) before then, please call [phone].  If you test positive for Covid 19 in the 2 weeks post procedure, please call and report this information to us.    If any biopsies were taken you will be contacted by phone or by letter within the next 1-3 weeks.  Please call us at [phone] if you have not heard about the biopsies in 3 weeks.    SIGNATURES/CONFIDENTIALITY: You and/or your care partner have signed paperwork which will be entered into your electronic medical record.  These signatures attest to the fact that that the information above on your After Visit Summary has been reviewed and is understood.  Full responsibility of the confidentiality of this discharge information lies with you and/or your care-partner. 

## 2019-07-15 NOTE — Progress Notes (Signed)
VS- Wayne Pierce Temperature- [name]  Colostomy in place  Pt's states no medical or surgical changes since previsit or office visit.  Last vape use 07-14-19
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
## 2019-07-15 NOTE — Progress Notes (Signed)
Report given to PACU, vss 

## 2019-07-21 ENCOUNTER — Telehealth: Payer: Self-pay

## 2019-07-21 NOTE — Telephone Encounter (Signed)
No answer, left message to call back later today, B.Arlin Sass RN. 

## 2019-07-21 NOTE — Telephone Encounter (Signed)
  Follow up Call-  Call back number 07/15/2019  Post procedure Call Back phone  # [phone]  Permission to leave phone message Yes  Some recent data might be hidden     Patient questions:  Do you have a fever, pain , or abdominal swelling? No. Pain Score  0 *  Have you tolerated food without any problems? Yes.    Have you been able to return to your normal activities? Yes.    Do you have any questions about your discharge instructions: Diet   No. Medications  No. Follow up visit  Yes.    Do you have questions or concerns about your Care? No.  Actions: * If pain score is 4 or above: No action needed, pain <4.  Pt was asking about referral to surgeon for colostomy reversal.  Explained that things may be delayed due to the holiday.  Advised him to contact Dr. Loletha Carrow' office on Wednesday if he hasn't received any information regarding a referral.  He agreed.

## 2019-07-21 NOTE — Telephone Encounter (Signed)
Pt returning your call

## 2019-08-05 ENCOUNTER — Telehealth: Payer: Self-pay | Admitting: Gastroenterology

## 2019-08-05 NOTE — Telephone Encounter (Signed)
Spoke to patient and told patient to follow up with [facility]. This RN faxed colonoscopy report to [facility] in order for the patient to be scheduled.

## 2019-08-22 ENCOUNTER — Emergency Department (HOSPITAL_COMMUNITY): Payer: No Typology Code available for payment source

## 2019-08-22 ENCOUNTER — Emergency Department (HOSPITAL_COMMUNITY)
Admission: EM | Admit: 2019-08-22 | Discharge: 2019-08-22 | Payer: No Typology Code available for payment source | Attending: Emergency Medicine | Admitting: Emergency Medicine

## 2019-08-22 DIAGNOSIS — S0993XA Unspecified injury of face, initial encounter: Secondary | ICD-10-CM | POA: Diagnosis not present

## 2019-08-22 DIAGNOSIS — F10920 Alcohol use, unspecified with intoxication, uncomplicated: Secondary | ICD-10-CM

## 2019-08-22 DIAGNOSIS — R109 Unspecified abdominal pain: Secondary | ICD-10-CM | POA: Diagnosis not present

## 2019-08-22 DIAGNOSIS — F10220 Alcohol dependence with intoxication, uncomplicated: Secondary | ICD-10-CM | POA: Insufficient documentation

## 2019-08-22 DIAGNOSIS — Y92410 Unspecified street and highway as the place of occurrence of the external cause: Secondary | ICD-10-CM | POA: Diagnosis not present

## 2019-08-22 DIAGNOSIS — Z87891 Personal history of nicotine dependence: Secondary | ICD-10-CM | POA: Insufficient documentation

## 2019-08-22 DIAGNOSIS — Y9389 Activity, other specified: Secondary | ICD-10-CM | POA: Insufficient documentation

## 2019-08-22 DIAGNOSIS — Z933 Colostomy status: Secondary | ICD-10-CM | POA: Insufficient documentation

## 2019-08-22 DIAGNOSIS — Y999 Unspecified external cause status: Secondary | ICD-10-CM | POA: Insufficient documentation

## 2019-08-22 DIAGNOSIS — S1081XA Abrasion of other specified part of neck, initial encounter: Secondary | ICD-10-CM | POA: Insufficient documentation

## 2019-08-22 DIAGNOSIS — Y907 Blood alcohol level of 200-239 mg/100 ml: Secondary | ICD-10-CM | POA: Diagnosis not present

## 2019-08-22 LAB — BASIC METABOLIC PANEL
Anion gap: 13 (ref 5–15)
BUN: 13 mg/dL (ref 6–20)
CO2: 26 mmol/L (ref 22–32)
Calcium: 9 mg/dL (ref 8.9–10.3)
Chloride: 105 mmol/L (ref 98–111)
Creatinine, Ser: 1.22 mg/dL (ref 0.61–1.24)
GFR calc Af Amer: >60 mL/min (ref >60)
GFR calc non Af Amer: >60 mL/min (ref >60)
Glucose, Bld: 95 mg/dL (ref 70–99)
Potassium: 3.9 mmol/L (ref 3.5–5.1)
Sodium: 144 mmol/L (ref 135–145)

## 2019-08-22 LAB — RAPID URINE DRUG SCREEN, HOSP PERFORMED
Amphetamines: NOT DETECTED
Barbiturates: NOT DETECTED
Benzodiazepines: NOT DETECTED
Cocaine: NOT DETECTED
Opiates: NOT DETECTED
Tetrahydrocannabinol: NOT DETECTED

## 2019-08-22 LAB — CBC WITH DIFFERENTIAL/PLATELET
Abs Immature Granulocytes: 0.01 10*3/uL (ref 0.00–0.07)
Basophils Absolute: 0.1 10*3/uL (ref 0.0–0.1)
Basophils Relative: 1 %
Eosinophils Absolute: 0.1 10*3/uL (ref 0.0–0.5)
Eosinophils Relative: 2 %
HCT: 48.1 % (ref 39.0–52.0)
Hemoglobin: 16.5 g/dL (ref 13.0–17.0)
Immature Granulocytes: 0 %
Lymphocytes Relative: 34 %
Lymphs Abs: 2.1 10*3/uL (ref 0.7–4.0)
MCH: 31 pg (ref 26.0–34.0)
MCHC: 34.3 g/dL (ref 30.0–36.0)
MCV: 90.4 fL (ref 80.0–100.0)
Monocytes Absolute: 0.6 10*3/uL (ref 0.1–1.0)
Monocytes Relative: 10 %
Neutro Abs: 3.3 10*3/uL (ref 1.7–7.7)
Neutrophils Relative %: 53 %
Platelets: 257 10*3/uL (ref 150–400)
RBC: 5.32 MIL/uL (ref 4.22–5.81)
RDW: 13 % (ref 11.5–15.5)
WBC: 6.1 10*3/uL (ref 4.0–10.5)
nRBC: 0 % (ref 0.0–0.2)

## 2019-08-22 LAB — ETHANOL: Alcohol, Ethyl (B): 237 mg/dL — ABNORMAL HIGH (ref <10)

## 2019-08-22 MED ORDER — SODIUM CHLORIDE (PF) 0.9 % IJ SOLN
INTRAMUSCULAR | Status: AC
  Filled 2019-08-22: qty 50

## 2019-08-22 MED ORDER — IOHEXOL 300 MG/ML  SOLN
100.0000 mL | Freq: Once | INTRAMUSCULAR | Status: AC | PRN
  Administered 2019-08-22: 100 mL via INTRAVENOUS
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
## 2019-08-22 NOTE — Discharge Instructions (Addendum)

## 2019-08-22 NOTE — ED Notes (Signed)
Urinal at bedside. Pt has been made aware that MD needs UA sample. 

## 2019-08-22 NOTE — ED Notes (Signed)
Pt ambulated to the restroom.

## 2019-08-22 NOTE — ED Notes (Signed)
EDPA notified of patient being uncooperative. No vitals due to patient being uncooperative.
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
## 2019-11-03 ENCOUNTER — Encounter (HOSPITAL_COMMUNITY): Payer: Self-pay

## 2019-11-03 ENCOUNTER — Other Ambulatory Visit: Payer: Self-pay

## 2019-11-03 ENCOUNTER — Ambulatory Visit (HOSPITAL_COMMUNITY)
Admission: EM | Admit: 2019-11-03 | Discharge: 2019-11-03 | Disposition: A | Discharge status: Home / Self Care | Payer: Self-pay | Attending: Family Medicine | Admitting: Family Medicine

## 2019-11-03 DIAGNOSIS — B009 Herpesviral infection, unspecified: Secondary | ICD-10-CM

## 2019-11-03 MED ORDER — VALACYCLOVIR HCL 1 G PO TABS: 1000.0000 mg | ORAL_TABLET | Freq: Two times a day (BID) | ORAL | 0 refills | Status: DC

## 2019-11-03 MED ORDER — ACYCLOVIR 400 MG PO TABS: 400.0000 mg | ORAL_TABLET | Freq: Three times a day (TID) | ORAL | 0 refills | Status: AC

## 2019-11-03 NOTE — Discharge Instructions (Addendum)
Treating you for herpes.  Take the medication twice a day for the next 7 days. Make sure you are taking precautions Follow up as needed for continued or worsening symptoms

## 2019-11-03 NOTE — ED Triage Notes (Addendum)
Pt states 2 wks ago he noticed a scratch on his penis, then 4 days ago pt noticed red, raised bumps on penis. Pt states having irritating pain in penis and pelvic area. Pt denies penile discharge. Pt states he's having some pain w/urination, urinary frequency.
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
## 2020-08-06 ENCOUNTER — Emergency Department (HOSPITAL_COMMUNITY)
Admission: EM | Admit: 2020-08-06 | Discharge: 2020-08-07 | Disposition: A | Discharge status: Home / Self Care | Attending: Emergency Medicine | Admitting: Emergency Medicine

## 2020-08-06 ENCOUNTER — Encounter (HOSPITAL_COMMUNITY): Payer: Self-pay | Admitting: Emergency Medicine

## 2020-08-06 DIAGNOSIS — K9409 Other complications of colostomy: Secondary | ICD-10-CM | POA: Diagnosis present

## 2020-08-06 DIAGNOSIS — Z87891 Personal history of nicotine dependence: Secondary | ICD-10-CM | POA: Diagnosis not present

## 2020-08-06 DIAGNOSIS — R1032 Left lower quadrant pain: Secondary | ICD-10-CM | POA: Diagnosis not present

## 2020-08-06 DIAGNOSIS — K121 Other forms of stomatitis: Secondary | ICD-10-CM | POA: Diagnosis not present
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
## 2020-08-07 LAB — CBC WITH DIFFERENTIAL/PLATELET
Abs Immature Granulocytes: 0.01 10*3/uL (ref 0.00–0.07)
Basophils Absolute: 0 10*3/uL (ref 0.0–0.1)
Basophils Relative: 0 %
Eosinophils Absolute: 0.1 10*3/uL (ref 0.0–0.5)
Eosinophils Relative: 1 %
HCT: 47.1 % (ref 39.0–52.0)
Hemoglobin: 16.1 g/dL (ref 13.0–17.0)
Immature Granulocytes: 0 %
Lymphocytes Relative: 36 %
Lymphs Abs: 2.1 10*3/uL (ref 0.7–4.0)
MCH: 30.4 pg (ref 26.0–34.0)
MCHC: 34.2 g/dL (ref 30.0–36.0)
MCV: 89 fL (ref 80.0–100.0)
Monocytes Absolute: 0.9 10*3/uL (ref 0.1–1.0)
Monocytes Relative: 16 %
Neutro Abs: 2.8 10*3/uL (ref 1.7–7.7)
Neutrophils Relative %: 47 %
Platelets: 243 10*3/uL (ref 150–400)
RBC: 5.29 MIL/uL (ref 4.22–5.81)
RDW: 12.2 % (ref 11.5–15.5)
WBC: 5.9 10*3/uL (ref 4.0–10.5)
nRBC: 0 % (ref 0.0–0.2)

## 2020-08-07 LAB — COMPREHENSIVE METABOLIC PANEL WITH GFR
ALT: 70 U/L — ABNORMAL HIGH (ref 0–44)
AST: 51 U/L — ABNORMAL HIGH (ref 15–41)
Albumin: 4.1 g/dL (ref 3.5–5.0)
Alkaline Phosphatase: 57 U/L (ref 38–126)
Anion gap: 13 (ref 5–15)
BUN: 13 mg/dL (ref 6–20)
CO2: 20 mmol/L — ABNORMAL LOW (ref 22–32)
Calcium: 9 mg/dL (ref 8.9–10.3)
Chloride: 105 mmol/L (ref 98–111)
Creatinine, Ser: 1.07 mg/dL (ref 0.61–1.24)
GFR, Estimated: >60 mL/min (ref >60)
Glucose, Bld: 89 mg/dL (ref 70–99)
Potassium: 5.1 mmol/L (ref 3.5–5.1)
Sodium: 138 mmol/L (ref 135–145)
Total Bilirubin: 1 mg/dL (ref 0.3–1.2)
Total Protein: 7.6 g/dL (ref 6.5–8.1)

## 2020-08-07 LAB — LACTIC ACID, PLASMA: Lactic Acid, Venous: 0.9 mmol/L (ref 0.5–1.9)

## 2020-08-07 MED ORDER — NYSTATIN 100000 UNIT/GM EX POWD
Freq: Once | CUTANEOUS | Status: AC
  Filled 2020-08-07: qty 15

## 2020-08-07 MED ORDER — NYSTATIN 100000 UNIT/GM EX POWD: 1.0000 "application " | Freq: Three times a day (TID) | CUTANEOUS | 0 refills | Status: AC

## 2020-08-07 NOTE — Discharge Instructions (Addendum)
Our care management team will call to help coordinate ostomy care follow up.  Patient will need to apply nystatin powder three times per day on his stoma/ostomy.
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
## 2023-05-04 ENCOUNTER — Emergency Department
Admission: EM | Admit: 2023-05-04 | Discharge: 2023-05-04 | Discharge status: Against Medical Advice | Payer: Medicaid Other | Attending: Emergency Medicine | Admitting: Emergency Medicine

## 2023-05-04 ENCOUNTER — Other Ambulatory Visit: Payer: Self-pay

## 2023-05-04 DIAGNOSIS — R109 Unspecified abdominal pain: Secondary | ICD-10-CM | POA: Insufficient documentation

## 2023-05-04 DIAGNOSIS — Z5321 Procedure and treatment not carried out due to patient leaving prior to being seen by health care provider: Secondary | ICD-10-CM | POA: Diagnosis not present

## 2023-05-04 LAB — URINALYSIS, ROUTINE W REFLEX MICROSCOPIC
Bilirubin Urine: NEGATIVE
Glucose, UA: NEGATIVE mg/dL
Hgb urine dipstick: NEGATIVE
Ketones, ur: NEGATIVE mg/dL
Leukocytes,Ua: NEGATIVE
Nitrite: NEGATIVE
Protein, ur: NEGATIVE mg/dL
Specific Gravity, Urine: 1.021 (ref 1.005–1.030)
pH: 6 (ref 5.0–8.0)

## 2023-05-04 LAB — CBC
HCT: 44.7 % (ref 39.0–52.0)
Hemoglobin: 15.3 g/dL (ref 13.0–17.0)
MCH: 30.7 pg (ref 26.0–34.0)
MCHC: 34.2 g/dL (ref 30.0–36.0)
MCV: 89.8 fL (ref 80.0–100.0)
Platelets: 267 10*3/uL (ref 150–400)
RBC: 4.98 MIL/uL (ref 4.22–5.81)
RDW: 12.7 % (ref 11.5–15.5)
WBC: 4.2 10*3/uL (ref 4.0–10.5)
nRBC: 0 % (ref 0.0–0.2)

## 2023-05-04 LAB — COMPREHENSIVE METABOLIC PANEL
ALT: 39 U/L (ref 0–44)
AST: 34 U/L (ref 15–41)
Albumin: 4 g/dL (ref 3.5–5.0)
Alkaline Phosphatase: 65 U/L (ref 38–126)
Anion gap: 7 (ref 5–15)
BUN: 15 mg/dL (ref 6–20)
CO2: 25 mmol/L (ref 22–32)
Calcium: 9.2 mg/dL (ref 8.9–10.3)
Chloride: 103 mmol/L (ref 98–111)
Creatinine, Ser: 0.99 mg/dL (ref 0.61–1.24)
GFR, Estimated: >60 mL/min (ref >60)
Glucose, Bld: 111 mg/dL — ABNORMAL HIGH (ref 70–99)
Potassium: 4.3 mmol/L (ref 3.5–5.1)
Sodium: 135 mmol/L (ref 135–145)
Total Bilirubin: 0.8 mg/dL (ref 0.3–1.2)
Total Protein: 7.9 g/dL (ref 6.5–8.1)

## 2023-05-04 LAB — LIPASE, BLOOD: Lipase: 32 U/L (ref 11–51)
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
